# Patient Record
Sex: Male | Born: 1945 | Race: White | Hispanic: No | Marital: Married | Smoking: Current some day smoker
Health system: Southern US, Community
[De-identification: ages and names within clinical notes are randomized; demographics above are authoritative.]

## PROBLEM LIST (undated history)

## (undated) DIAGNOSIS — E119 Type 2 diabetes mellitus without complications: Secondary | ICD-10-CM

## (undated) DIAGNOSIS — F17200 Nicotine dependence, unspecified, uncomplicated: Secondary | ICD-10-CM

## (undated) DIAGNOSIS — I10 Essential (primary) hypertension: Secondary | ICD-10-CM

## (undated) DIAGNOSIS — C439 Malignant melanoma of skin, unspecified: Secondary | ICD-10-CM

## (undated) HISTORY — DX: Nicotine dependence, unspecified, uncomplicated: F17.200

## (undated) HISTORY — DX: Malignant melanoma of skin, unspecified: C43.9

## (undated) HISTORY — PX: OTHER SURGICAL HISTORY: SHX169

## (undated) HISTORY — PX: MELANOMA EXCISION: SHX5266

## (undated) HISTORY — PX: KNEE ARTHROSCOPY: SUR90

---

## 1998-02-19 ENCOUNTER — Other Ambulatory Visit: Admission: RE | Admit: 1998-02-19 | Discharge: 1998-02-19 | Payer: Self-pay | Admitting: *Deleted

## 2009-01-25 ENCOUNTER — Emergency Department (HOSPITAL_COMMUNITY): Admission: EM | Admit: 2009-01-25 | Discharge: 2009-01-25 | Payer: Self-pay | Admitting: Emergency Medicine

## 2010-04-11 LAB — BASIC METABOLIC PANEL WITH GFR
BUN: 11 mg/dL (ref 6–23)
CO2: 27 meq/L (ref 19–32)
Calcium: 9 mg/dL (ref 8.4–10.5)
Chloride: 107 meq/L (ref 96–112)
Creatinine, Ser: 0.67 mg/dL (ref 0.4–1.2)
GFR calc non Af Amer: >60 mL/min
Glucose, Bld: 83 mg/dL (ref 70–99)
Potassium: 4.2 meq/L (ref 3.5–5.1)
Sodium: 140 meq/L (ref 135–145)

## 2010-04-11 LAB — URINE CULTURE
Colony Count: NO GROWTH
Culture: NO GROWTH

## 2010-04-11 LAB — URINALYSIS, ROUTINE W REFLEX MICROSCOPIC
Bilirubin Urine: NEGATIVE
Specific Gravity, Urine: <1.005 — ABNORMAL LOW (ref 1.005–1.030)
pH: 7 (ref 5.0–8.0)

## 2010-04-11 LAB — DIFFERENTIAL
Basophils Absolute: 0 10*3/uL (ref 0.0–0.1)
Basophils Relative: 0 % (ref 0–1)
Eosinophils Absolute: 0.1 10*3/uL (ref 0.0–0.7)
Eosinophils Relative: 1 % (ref 0–5)
Lymphocytes Relative: 20 % (ref 12–46)
Lymphs Abs: 1.5 10*3/uL (ref 0.7–4.0)
Monocytes Absolute: 0.6 10*3/uL (ref 0.1–1.0)
Monocytes Relative: 8 % (ref 3–12)
Neutro Abs: 5.5 10*3/uL (ref 1.7–7.7)
Neutrophils Relative %: 71 % (ref 43–77)

## 2010-04-11 LAB — CBC
Hemoglobin: 15 g/dL (ref 12.0–15.0)
MCHC: 34.6 g/dL (ref 30.0–36.0)
Platelets: 139 10*3/uL — ABNORMAL LOW (ref 150–400)
RBC: 4.48 MIL/uL (ref 3.87–5.11)
RDW: 13.3 % (ref 11.5–15.5)
WBC: 7.7 10*3/uL (ref 4.0–10.5)

## 2010-04-11 LAB — URINE MICROSCOPIC-ADD ON

## 2011-06-27 DIAGNOSIS — E119 Type 2 diabetes mellitus without complications: Secondary | ICD-10-CM | POA: Diagnosis not present

## 2011-06-27 DIAGNOSIS — I10 Essential (primary) hypertension: Secondary | ICD-10-CM | POA: Diagnosis not present

## 2011-06-27 DIAGNOSIS — R05 Cough: Secondary | ICD-10-CM | POA: Diagnosis not present

## 2011-07-21 ENCOUNTER — Telehealth: Payer: Self-pay

## 2011-07-21 NOTE — Telephone Encounter (Signed)
Pt says he is working out of town now. He will know more in a couple of weeks and will let me know.

## 2011-09-05 NOTE — Telephone Encounter (Signed)
Letter mailed to pt to call.  

## 2011-12-16 DIAGNOSIS — I10 Essential (primary) hypertension: Secondary | ICD-10-CM | POA: Diagnosis not present

## 2011-12-16 DIAGNOSIS — E119 Type 2 diabetes mellitus without complications: Secondary | ICD-10-CM | POA: Diagnosis not present

## 2012-03-03 ENCOUNTER — Emergency Department (HOSPITAL_COMMUNITY): Payer: 59

## 2012-03-03 ENCOUNTER — Emergency Department (HOSPITAL_COMMUNITY)
Admission: EM | Admit: 2012-03-03 | Discharge: 2012-03-03 | Disposition: A | Discharge status: Home / Self Care | Payer: 59 | Attending: Emergency Medicine | Admitting: Emergency Medicine

## 2012-03-03 ENCOUNTER — Other Ambulatory Visit: Payer: Self-pay

## 2012-03-03 ENCOUNTER — Encounter (HOSPITAL_COMMUNITY): Payer: Self-pay | Admitting: *Deleted

## 2012-03-03 DIAGNOSIS — Z79899 Other long term (current) drug therapy: Secondary | ICD-10-CM | POA: Diagnosis not present

## 2012-03-03 DIAGNOSIS — I10 Essential (primary) hypertension: Secondary | ICD-10-CM | POA: Insufficient documentation

## 2012-03-03 DIAGNOSIS — F17200 Nicotine dependence, unspecified, uncomplicated: Secondary | ICD-10-CM | POA: Diagnosis not present

## 2012-03-03 DIAGNOSIS — Z85820 Personal history of malignant melanoma of skin: Secondary | ICD-10-CM | POA: Insufficient documentation

## 2012-03-03 DIAGNOSIS — E119 Type 2 diabetes mellitus without complications: Secondary | ICD-10-CM | POA: Diagnosis not present

## 2012-03-03 DIAGNOSIS — Z7982 Long term (current) use of aspirin: Secondary | ICD-10-CM | POA: Diagnosis not present

## 2012-03-03 DIAGNOSIS — R079 Chest pain, unspecified: Secondary | ICD-10-CM | POA: Insufficient documentation

## 2012-03-03 HISTORY — DX: Essential (primary) hypertension: I10

## 2012-03-03 HISTORY — DX: Type 2 diabetes mellitus without complications: E11.9

## 2012-03-03 LAB — BASIC METABOLIC PANEL
BUN: 15 mg/dL (ref 6–23)
Calcium: 9.3 mg/dL (ref 8.4–10.5)
Creatinine, Ser: 0.8 mg/dL (ref 0.50–1.35)
GFR calc non Af Amer: 89 mL/min — ABNORMAL LOW (ref >90)
Potassium: 3.6 mEq/L (ref 3.5–5.1)

## 2012-03-03 LAB — CBC
MCH: 33.2 pg (ref 26.0–34.0)
MCHC: 35.3 g/dL (ref 30.0–36.0)
MCV: 94.1 fL (ref 78.0–100.0)
Platelets: 143 10*3/uL — ABNORMAL LOW (ref 150–400)
RBC: 4.25 MIL/uL (ref 4.22–5.81)

## 2012-03-03 LAB — TROPONIN I: Troponin I: <0.3 ng/mL (ref <0.30)

## 2012-03-03 MED ORDER — HYDROCODONE-ACETAMINOPHEN 5-325 MG PO TABS: 1.0000 | ORAL_TABLET | Freq: Four times a day (QID) | ORAL | Status: DC | PRN

## 2012-03-03 MED ORDER — ASPIRIN 81 MG PO CHEW
324.0000 mg | CHEWABLE_TABLET | Freq: Once | ORAL | Status: AC
  Administered 2012-03-03: 162 mg via ORAL
  Filled 2012-03-03: qty 4

## 2012-03-03 MED ORDER — IPRATROPIUM BROMIDE 0.02 % IN SOLN: 0.5000 mg | Freq: Once | RESPIRATORY_TRACT | Status: DC

## 2012-03-03 MED ORDER — ALBUTEROL SULFATE (5 MG/ML) 0.5% IN NEBU: 2.5000 mg | INHALATION_SOLUTION | Freq: Once | RESPIRATORY_TRACT | Status: DC

## 2012-03-03 MED ORDER — KETOROLAC TROMETHAMINE 30 MG/ML IJ SOLN
30.0000 mg | Freq: Once | INTRAMUSCULAR | Status: AC
  Administered 2012-03-03: 30 mg via INTRAMUSCULAR
  Filled 2012-03-03: qty 1

## 2012-03-03 NOTE — ED Notes (Signed)
States he is feeling better now, states pain is much better after medication.

## 2012-03-03 NOTE — ED Notes (Signed)
Pt reporting pain in right side and into back.  Pt denies recent illness.  Denies nausea, vomiting, SOB or cough.

## 2012-03-03 NOTE — ED Notes (Signed)
MD at bedside. 

## 2012-03-03 NOTE — ED Notes (Signed)
Patient states that his right sided chest pain continues, denies shortness of breath.  States that the pain is intermittent in nature and has gotten worse since 3 am today.

## 2012-03-03 NOTE — ED Provider Notes (Addendum)
History     CSN: [id number]  Arrival date & time 03/03/12  0602   First MD Initiated Contact with Patient 03/03/12 0622      Chief Complaint  Patient presents with  . Chest Pain    (Consider location/radiation/quality/duration/timing/severity/associated sxs/prior treatment) HPI Scott Ballard is a 67 y.o. male presents with acute onset of right sided chest pain located along the mid axillary line it radiates to the back describes this as a sharp pain, up to a 5 or 6/10 in intensity, it comes and goes and lasts for 20-30 minutes when it is present, then resolves to about a 1-2/10.  This pain awoke the patient at 2:00 this morning, continue to bother him all night, he got up to 5 to check a blood glucose and eat a couple of pops charts, and then decided the pain was becoming too intense and decided to come to the ER. Eating food did not exacerbate or alleviate the problem. He's not taken anything else. There are no other alleviating or exacerbating factors. Patient denies any shortness of breath, denies pleuritic chest pain, denies any recent cough, upper respiratory symptoms including Sore throat or rhinorrhea, ear pain, fever or chills.  He denies hemoptysis, any recent extended travel, any recent cancer treatments, or long bone fractures. Patient does have a history of melanoma excision on the right lower oblique region as well as along the upper middle back and one over the right scapula - this was over 10 years ago.  Patient has a history of diabetes, he says he tends to run low, and has had one low episode last week and is typically resolved by eating. Patient also has a history of hypertension. No family history of DVT or thromboembolic disease. Patient denies any alcohol or illegal drug use, says he does chew on cigars and occasionally lights them.   Past Medical History  Diagnosis Date  . Diabetes mellitus without complication   . Hypertension     Past Surgical History  Procedure  Laterality Date  . Joint replacement    . Melanoma excision      History reviewed. No pertinent family history.  History  Substance Use Topics  . Smoking status: Current Every Day Smoker    Types: Cigars  . Smokeless tobacco: Not on file  . Alcohol Use: No      Review of Systems At least 10pt or greater review of systems completed and are negative except where specified in the HPI.  Allergies  Review of patient's allergies indicates no known allergies.  Home Medications   Current Outpatient Rx  Name  Route  Sig  Dispense  Refill  . aspirin 81 MG tablet   Oral   Take 81 mg by mouth daily.         Marland Kitchen lisinopril (PRINIVIL,ZESTRIL) 10 MG tablet   Oral   Take 10 mg by mouth daily.         . vitamin B-12 (CYANOCOBALAMIN) 100 MCG tablet   Oral   Take 50 mcg by mouth daily.           There were no vitals taken for this visit.  Physical Exam  Nursing notes reviewed.  Electronic medical record reviewed. VITAL SIGNS:   Filed Vitals:   03/03/12 0627  BP: 149/64  Pulse: 57  Temp: 97.8 F (36.6 C)  TempSrc: Oral  Resp: 15  Height: 6\' 6"  (1.981 m)  Weight: 212 lb (96.163 kg)  SpO2: 99%   CONSTITUTIONAL: Awake,  oriented, appears non-toxic HENT: Atraumatic, normocephalic, oral mucosa pink and moist, airway patent. Nares patent without drainage. External ears normal. EYES: Conjunctiva clear, EOMI, PERRLA NECK: Trachea midline, non-tender, supple CARDIOVASCULAR: Normal heart rate, Normal rhythm, No murmurs, rubs, gallops PULMONARY/CHEST: Clear to auscultation, no rhonchi, wheezes, or rales. Symmetrical breath sounds. Non-tender. ABDOMINAL: Non-distended, soft, non-tender - no rebound or guarding.  BS normal. NEUROLOGIC: Non-focal, moving all four extremities, no gross sensory or motor deficits. EXTREMITIES: No clubbing, cyanosis, or edema SKIN: Fair skinned, Warm, Dry, No erythema, No rash. 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  ED Course  Procedures (including critical care time)  Date: 03/03/2012  Rate: 54  Rhythm: normal sinus rhythm  QRS Axis: normal  Intervals: normal  ST/T Wave abnormalities: T-wave flattening in lead 3 and aVF  Conduction Disutrbances: First degree AV block, LAFB  Narrative Interpretation: This is a nonspecific flattening of T waves in leads 3 and aVF, otherwise EKG is unremarkable. No acute ischemia or infarction. No prior EKG for comparison.   Labs Reviewed  CBC - Abnormal; Notable for the following:    Platelets 143 (*)    All other components within normal limits  BASIC METABOLIC PANEL - Abnormal; Notable for the following:    Sodium 134 (*)    Glucose, Bld 205 (*)    GFR calc non Af Amer 89 (*)    All other components within normal limits  TROPONIN I   Dg Chest 2 View  03/03/2012  *RADIOLOGY REPORT*  Clinical Data: Chest pain  CHEST - 2 VIEW  Comparison: None.  Findings: Lucency in the upper lungs suggesting emphysematous change.  Normal heart size and pulmonary vascularity.  No focal consolidation or airspace disease.  No blunting of costophrenic angles.  No pneumothorax.  Calcification of the aorta. Degenerative changes in the spine.  IMPRESSION: Probable emphysematous changes in the upper lungs.  No evidence of active pulmonary disease.   Original Report Authenticated By: Burman Nieves, M.D.      1. Chest pain       MDM  Scott Ballard is a 67 y.o. male history of diabetes, hypertension and melanoma presents with right-sided chest pain that radiates to the back. Patient has no explicit back pain, no night sweats, no weight loss. Patient has no other symptoms.  Check an EKG, basic labs, cardiac biomarkers well as chest x-ray - which are all within normal limits. Patient does have slightly elevated glucose however he does need to pop tart  prior to coming to the emergency department. Patient's pain is better with Toradol, is now currently a 1/10.  Discussed obtaining risks and benefits of obtaining a CT of the chest with contrast and the risk of contrast-induced nephropathy, patient wishes to defer that as I have a low pretest probability for finding any pathology in his chest at this time. Do not think the patient has a PE.  I also do not think this atypical presentation, even in a diabetic, is concerning for ACS.  Patient will followup with his primary care physician later this week for possible repeat chest x-ray and reexamination.  Patient is to return to the emergency department if any new symptoms arise or should current symptoms worsen. Patient understands and accepts the medical plan as it's been dictated, his questions as well as  questions have been answered.          Jones Skene, MD 03/03/12 1610  Jones Skene, MD 03/03/12 [id number]

## 2012-12-19 ENCOUNTER — Other Ambulatory Visit: Payer: Self-pay | Admitting: Family Medicine

## 2013-01-22 ENCOUNTER — Other Ambulatory Visit: Payer: Self-pay | Admitting: Family Medicine

## 2013-01-28 ENCOUNTER — Ambulatory Visit (INDEPENDENT_AMBULATORY_CARE_PROVIDER_SITE_OTHER): Payer: 59 | Admitting: Physician Assistant

## 2013-01-28 ENCOUNTER — Encounter: Payer: Self-pay | Admitting: Physician Assistant

## 2013-01-28 VITALS — BP 158/80 | HR 64 | Temp 98.7°F | Resp 18 | Ht 75.0 in | Wt 235.0 lb

## 2013-01-28 DIAGNOSIS — I10 Essential (primary) hypertension: Secondary | ICD-10-CM

## 2013-01-28 DIAGNOSIS — E119 Type 2 diabetes mellitus without complications: Secondary | ICD-10-CM | POA: Diagnosis not present

## 2013-01-28 DIAGNOSIS — F17200 Nicotine dependence, unspecified, uncomplicated: Secondary | ICD-10-CM

## 2013-01-28 LAB — BASIC METABOLIC PANEL WITH GFR
BUN: 13 mg/dL (ref 6–23)
CALCIUM: 8.6 mg/dL (ref 8.4–10.5)
CHLORIDE: 104 meq/L (ref 96–112)
CO2: 27 mEq/L (ref 19–32)
CREATININE: 0.81 mg/dL (ref 0.50–1.35)
GFR, Est Non African American: >89 mL/min
Glucose, Bld: 129 mg/dL — ABNORMAL HIGH (ref 70–99)
POTASSIUM: 4.2 meq/L (ref 3.5–5.3)
Sodium: 139 mEq/L (ref 135–145)

## 2013-01-28 LAB — HEMOGLOBIN A1C
Hgb A1c MFr Bld: 6 % — ABNORMAL HIGH (ref <5.7)
Mean Plasma Glucose: 126 mg/dL — ABNORMAL HIGH (ref <117)

## 2013-01-28 MED ORDER — LISINOPRIL 20 MG PO TABS: ORAL_TABLET | ORAL | Status: DC

## 2013-01-28 NOTE — Progress Notes (Signed)
Patient ID: Scott Ballard MRN: [id number], DOB: 28-Feb-1945, 68 y.o. Date of Encounter: @DATE @  Chief Complaint: No chief complaint on file.   HPI: 68 y.o. year old white male  presents for routine followup office visit.  His last office visit here was actually November 2013. It has been a little over one year. He says that he just hates to come to the doctor. He states that he had been taking his blood pressure medication as directed every day. Says that he called and got a refill that it just ran out about 2 days ago. He been taking the blood pressure medication daily except for just the past 2-3 days.  As well he has a history of diabetes which has been controlled with diet. He says that this was diagnosed at his first visit here in May 2009. He also notes that he weighed 280 pounds at that visit. I did review the chart and that is in fact true. His first visit here was May 2009 and his weight was exactly 280 pounds. He says that he was very compliant with diet changes. Says that he "lost 10 pounds in the first week after that visit".  As he continues to be compliant with diet changes. As well he says that he checks his blood sugar every morning and checks it at night once or twice per week. Visit the morning reading is 95-110. Nighttime reading is around 1:30.  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 He works 5 hours per day 4 days per week.  He smokes one cigar per week. This in the past he smoked one cigar daily. Has never smoked cigarettes.  Even with exertion, he has no chest discomfort and no increased shortness of breath/dyspnea on exertion. He has no complaints today and says he's been feeling good.   Past Medical History  Diagnosis Date  . Smoker   . Diabetes mellitus without complication   . Hypertension      Home Meds: See attached medication section for current medication list. Any medications entered into  computer today will not appear on this note's list. The medications listed below were entered prior to today. Current Outpatient Prescriptions on File Prior to Visit  Medication Sig Dispense Refill  . aspirin 81 MG tablet Take 81 mg by mouth daily.      . vitamin B-12 (CYANOCOBALAMIN) 100 MCG tablet Take 50 mcg by mouth daily.      Marland Kitchen HYDROcodone-acetaminophen (NORCO/VICODIN) 5-325 MG per tablet Take 1-2 tablets by mouth every 6 (six) hours as needed for pain.  13 tablet  0   No current facility-administered medications on file prior to visit.    Allergies: No Known Allergies  History   Social History  . Marital Status: Married    Spouse Name: N/A    Number of Children: N/A  . Years of Education: N/A   Occupational History  . Not on file.   Social History Main Topics  . Smoking status: Current Every Day Smoker    Types: Cigars  . Smokeless tobacco: Not on file  . Alcohol Use: No  . Drug Use: No  . Sexual Activity: Not on file   Other Topics Concern  . Not on file   Social History Narrative  . No narrative on file    No family history on file.   Review of Systems:  See HPI for pertinent ROS. All other ROS  negative.    Physical Exam: Blood pressure 158/80, pulse 64, temperature 98.7 F (37.1 C), resp. rate 18, height 6\' 3"  (1.905 m), weight 235 lb (106.595 kg)., Body mass index is 29.37 kg/(m^2). General:Tall, WNWD WM.  Appears in no acute distress. Neck: Supple. No thyromegaly. No lymphadenopathy. No carotid bruits. Lungs: Clear bilaterally to auscultation without wheezes, rales, or rhonchi. Breathing is unlabored. Heart: RRR with S1 S2. No murmurs, rubs, or gallops. Abdomen: Soft, non-tender, non-distended with normoactive bowel sounds. No hepatomegaly. No rebound/guarding. No obvious abdominal masses. Musculoskeletal:  Strength and tone normal for age. Extremities/Skin: Warm and dry. No clubbing or cyanosis. No edema. No rashes or suspicious lesions. Neuro: Alert  and oriented X 3. Moves all extremities spontaneously. Gait is normal. CNII-XII grossly in tact. Psych:  Responds to questions appropriately with a normal affect.     ASSESSMENT AND PLAN:  68 y.o. year old male with  1. Hypertension Blood pressure is slightly high today but he has been out of his blood pressure medication for 3 days. We'll go ahead and check labs monitor her renal function. He is to resume his blood pressure medication and return for blood pressure check. - BASIC METABOLIC PANEL WITH GFR - lisinopril (PRINIVIL,ZESTRIL) 20 MG tablet; TAKE 1 TABLET BY MOUTH EVERY DAY  Dispense: 30 tablet; Refill: 5  2. Diabetes mellitus without complication Diet controlled. According to his reported blood sugar readings this is continued to be controlled. We'll check A1c to be sure. - Hemoglobin A1c  3. Smoker He has decreased from one cigar daily down to one cigar 3 days per week. He is to continue to try to wean this down.  4. lipid panel was performed 06/28/2011 and was excellent. Triglycerides 124, HDL 41, LDL 77.  He can wait 6 months for regular office visit as labs remain stable. Otherwise return sooner.  We'll update preventive treatment at that next visit.   Signed, [address] [place], [place], [facility] 01/28/2013 9:32 AM

## 2013-01-29 ENCOUNTER — Encounter: Payer: Self-pay | Admitting: Family Medicine

## 2013-02-14 ENCOUNTER — Other Ambulatory Visit: Payer: Self-pay | Admitting: Physician Assistant

## 2013-02-14 DIAGNOSIS — E119 Type 2 diabetes mellitus without complications: Secondary | ICD-10-CM

## 2013-02-14 NOTE — Telephone Encounter (Signed)
Test strips refilled

## 2013-02-24 ENCOUNTER — Other Ambulatory Visit: Payer: Self-pay | Admitting: Physician Assistant

## 2013-02-25 NOTE — Telephone Encounter (Signed)
Medication refilled per protocol. 

## 2013-07-29 ENCOUNTER — Ambulatory Visit: Payer: Medicare Other | Admitting: Physician Assistant

## 2013-08-03 ENCOUNTER — Other Ambulatory Visit: Payer: Self-pay | Admitting: *Deleted

## 2013-08-03 DIAGNOSIS — I10 Essential (primary) hypertension: Secondary | ICD-10-CM

## 2013-08-03 DIAGNOSIS — E119 Type 2 diabetes mellitus without complications: Secondary | ICD-10-CM

## 2013-08-08 DIAGNOSIS — Z85820 Personal history of malignant melanoma of skin: Secondary | ICD-10-CM | POA: Diagnosis not present

## 2013-08-08 DIAGNOSIS — D235 Other benign neoplasm of skin of trunk: Secondary | ICD-10-CM | POA: Diagnosis not present

## 2013-08-08 DIAGNOSIS — L570 Actinic keratosis: Secondary | ICD-10-CM | POA: Diagnosis not present

## 2013-08-09 ENCOUNTER — Other Ambulatory Visit: Payer: Medicare Other

## 2013-08-09 DIAGNOSIS — I10 Essential (primary) hypertension: Secondary | ICD-10-CM | POA: Diagnosis not present

## 2013-08-09 DIAGNOSIS — E119 Type 2 diabetes mellitus without complications: Secondary | ICD-10-CM | POA: Diagnosis not present

## 2013-08-09 LAB — COMPLETE METABOLIC PANEL WITH GFR
ALK PHOS: 48 U/L (ref 39–117)
ALT: 13 U/L (ref 0–53)
AST: 15 U/L (ref 0–37)
Albumin: 3.9 g/dL (ref 3.5–5.2)
BILIRUBIN TOTAL: 0.9 mg/dL (ref 0.2–1.2)
BUN: 14 mg/dL (ref 6–23)
CALCIUM: 8.8 mg/dL (ref 8.4–10.5)
CO2: 26 meq/L (ref 19–32)
CREATININE: 0.82 mg/dL (ref 0.50–1.35)
Chloride: 105 mEq/L (ref 96–112)
Glucose, Bld: 95 mg/dL (ref 70–99)
Potassium: 4.6 mEq/L (ref 3.5–5.3)
Sodium: 139 mEq/L (ref 135–145)
Total Protein: 6.4 g/dL (ref 6.0–8.3)

## 2013-08-09 LAB — CBC WITH DIFFERENTIAL/PLATELET
Basophils Absolute: 0 10*3/uL (ref 0.0–0.1)
Basophils Relative: 0 % (ref 0–1)
EOS ABS: 0.1 10*3/uL (ref 0.0–0.7)
EOS PCT: 2 % (ref 0–5)
HEMATOCRIT: 41.6 % (ref 39.0–52.0)
Hemoglobin: 14.4 g/dL (ref 13.0–17.0)
LYMPHS PCT: 30 % (ref 12–46)
Lymphs Abs: 1.3 10*3/uL (ref 0.7–4.0)
MCH: 32.4 pg (ref 26.0–34.0)
MCHC: 34.6 g/dL (ref 30.0–36.0)
MCV: 93.5 fL (ref 78.0–100.0)
MONO ABS: 0.4 10*3/uL (ref 0.1–1.0)
Monocytes Relative: 10 % (ref 3–12)
NEUTROS ABS: 2.4 10*3/uL (ref 1.7–7.7)
Neutrophils Relative %: 58 % (ref 43–77)
PLATELETS: 162 10*3/uL (ref 150–400)
RBC: 4.45 MIL/uL (ref 4.22–5.81)
RDW: 14.6 % (ref 11.5–15.5)
WBC: 4.2 10*3/uL (ref 4.0–10.5)

## 2013-08-09 LAB — HEMOGLOBIN A1C
HEMOGLOBIN A1C: 5.8 % — AB (ref <5.7)
Mean Plasma Glucose: 120 mg/dL — ABNORMAL HIGH (ref <117)

## 2013-08-12 ENCOUNTER — Ambulatory Visit (INDEPENDENT_AMBULATORY_CARE_PROVIDER_SITE_OTHER): Payer: 59 | Admitting: Physician Assistant

## 2013-08-12 ENCOUNTER — Encounter: Payer: Self-pay | Admitting: Family Medicine

## 2013-08-12 ENCOUNTER — Encounter: Payer: Self-pay | Admitting: Physician Assistant

## 2013-08-12 VITALS — BP 118/66 | HR 72 | Temp 98.9°F | Resp 18 | Wt 228.0 lb

## 2013-08-12 DIAGNOSIS — Z23 Encounter for immunization: Secondary | ICD-10-CM | POA: Diagnosis not present

## 2013-08-12 DIAGNOSIS — F17200 Nicotine dependence, unspecified, uncomplicated: Secondary | ICD-10-CM | POA: Diagnosis not present

## 2013-08-12 DIAGNOSIS — I10 Essential (primary) hypertension: Secondary | ICD-10-CM | POA: Diagnosis not present

## 2013-08-12 DIAGNOSIS — E119 Type 2 diabetes mellitus without complications: Secondary | ICD-10-CM

## 2013-08-12 NOTE — Progress Notes (Signed)
Patient ID: Sofia Jaquith MRN: [id number], DOB: 1945/10/20, 68 y.o. Date of Encounter: '@DATE' @  Chief Complaint:  Chief Complaint  Patient presents with  . diabetic check up    had labs friday    HPI: 68 y.o. year old white male  presents for routine followup office visit.  His last office visit was 01/28/2013.  Prior to that visit, his LOV had been  November 2013. When he came 01/28/13 he said that he just hates to come to the doctor. Again today he says he came today "just to get his wife off his back" !!  He states that he had been taking his blood pressure medication as directed every day.  As well he has a history of diabetes which has been controlled with diet. He says that this was diagnosed at his first visit here in May 2009. He also notes that he weighed 280 pounds at that visit. I did review the chart and that is in fact true. His first visit here was May 2009 and his weight was exactly 280 pounds. He says that he was very compliant with diet changes. Says that he "lost 10 pounds in the first week after that visit".  Says he continues to be compliant with diet changes. As well he says that he checks his blood sugar every morning.  the morning reading is 88 - 110.   He "is retired" --now only works 2 days a week.  Has never smoked cigarettes. Has smoked cigars for years. Today says "chews on cigars" -- 1 every 2 days.  Even with exertion, he has no chest discomfort and no increased shortness of breath/dyspnea on exertion. He has no complaints today and says he's been feeling good.   Past Medical History  Diagnosis Date  . Smoker   . Diabetes mellitus without complication   . Hypertension      Home Meds: Outpatient Prescriptions Prior to Visit  Medication Sig Dispense Refill  . aspirin 81 MG tablet Take 81 mg by mouth daily.      Marland Kitchen glucose blood (ACCU-CHEK AVIVA PLUS) test strip 1 each by Other route daily.  100 each  6  . lisinopril (PRINIVIL,ZESTRIL) 20 MG tablet  TAKE 1 TABLET BY MOUTH EVERY DAY  30 tablet  5  . vitamin B-12 (CYANOCOBALAMIN) 100 MCG tablet Take 50 mcg by mouth daily.      Marland Kitchen HYDROcodone-acetaminophen (NORCO/VICODIN) 5-325 MG per tablet Take 1-2 tablets by mouth every 6 (six) hours as needed for pain.  13 tablet  0  . lisinopril (PRINIVIL,ZESTRIL) 20 MG tablet TAKE 1 TABLET BY MOUTH EVERY DAY  30 tablet  5   No facility-administered medications prior to visit.     Allergies: No Known Allergies  History   Social History  . Marital Status: Married    Spouse Name: N/A    Number of Children: N/A  . Years of Education: N/A   Occupational History  . Not on file.   Social History Main Topics  . Smoking status: Current Every Day Smoker    Types: Cigars  . Smokeless tobacco: Not on file  . Alcohol Use: No  . Drug Use: No  . Sexual Activity: Not on file   Other Topics Concern  . Not on file   Social History Narrative  . No narrative on file    No family history on file.   Review of Systems:  See HPI for pertinent ROS. All other ROS negative.  Physical Exam: Blood pressure 118/66, pulse 72, temperature 98.9 F (37.2 C), temperature source Oral, resp. rate 18, weight 228 lb (103.42 kg)., Body mass index is 28.5 kg/(m^2). General:Tall, WNWD WM.  Appears in no acute distress. Neck: Supple. No thyromegaly. No lymphadenopathy. No carotid bruits. Lungs: Clear bilaterally to auscultation without wheezes, rales, or rhonchi. Breathing is unlabored. Heart: RRR with S1 S2. No murmurs, rubs, or gallops. Abdomen: Soft, non-tender, non-distended with normoactive bowel sounds. No hepatomegaly. No rebound/guarding. No obvious abdominal masses. Musculoskeletal:  Strength and tone normal for age. Extremities/Skin: Warm and dry.  Neuro: Alert and oriented X 3. Moves all extremities spontaneously. Gait is normal. CNII-XII grossly in tact. Psych:  Responds to questions appropriately with a normal affect.   Results for orders placed in  visit on 08/09/13  CBC WITH DIFFERENTIAL      Result Value Ref Range   WBC 4.2  4.0 - 10.5 K/uL   RBC 4.45  4.22 - 5.81 MIL/uL   Hemoglobin 14.4  13.0 - 17.0 g/dL   HCT 41.6  39.0 - 52.0 %   MCV 93.5  78.0 - 100.0 fL   MCH 32.4  26.0 - 34.0 pg   MCHC 34.6  30.0 - 36.0 g/dL   RDW 14.6  11.5 - 15.5 %   Platelets 162  150 - 400 K/uL   Neutrophils Relative % 58  43 - 77 %   Neutro Abs 2.4  1.7 - 7.7 K/uL   Lymphocytes Relative 30  12 - 46 %   Lymphs Abs 1.3  0.7 - 4.0 K/uL   Monocytes Relative 10  3 - 12 %   Monocytes Absolute 0.4  0.1 - 1.0 K/uL   Eosinophils Relative 2  0 - 5 %   Eosinophils Absolute 0.1  0.0 - 0.7 K/uL   Basophils Relative 0  0 - 1 %   Basophils Absolute 0.0  0.0 - 0.1 K/uL   Smear Review Criteria for review not met    COMPLETE METABOLIC PANEL WITH GFR      Result Value Ref Range   Sodium 139  135 - 145 mEq/L   Potassium 4.6  3.5 - 5.3 mEq/L   Chloride 105  96 - 112 mEq/L   CO2 26  19 - 32 mEq/L   Glucose, Bld 95  70 - 99 mg/dL   BUN 14  6 - 23 mg/dL   Creat 0.82  0.50 - 1.35 mg/dL   Total Bilirubin 0.9  0.2 - 1.2 mg/dL   Alkaline Phosphatase 48  39 - 117 U/L   AST 15  0 - 37 U/L   ALT 13  0 - 53 U/L   Total Protein 6.4  6.0 - 8.3 g/dL   Albumin 3.9  3.5 - 5.2 g/dL   Calcium 8.8  8.4 - 10.5 mg/dL   GFR, Est African American >89     GFR, Est Non African American >89    HEMOGLOBIN A1C      Result Value Ref Range   Hemoglobin A1C 5.8 (*) <5.7 %   Mean Plasma Glucose 120 (*) <117 mg/dL     ASSESSMENT AND PLAN:  68 y.o. year old male with  1. Hypertension Blood pressure is at goal. Lab is nml. Cont currnet BP med.   2. Diabetes mellitus without complication Diet controlled.  3. Smoker He has decreased -- He has weaned this down.  4. lipid panel was performed 06/28/2011 and was excellent. Triglycerides 124, HDL 41,  LDL 77.  5. Screening Colonoscopy: Staff today tells me that we have scheduled him for colonoscopy 3 times and he has canceled all of  them. However the patient tells me that he currently has a notification at now going for a colonoscopy and he says that he does plan to followup with going for this one.  Offered to do Hemoccult cards instead he says he is: To go for colonoscopy  6. Immunizations: Tetanus: He says he has had no tetanus vaccine in the past 10 years. He is agreeable for [place] to give this today. Pneumonia vaccine: He received Pneumovax 23 06/06/2007. Will give Prevnar 13 today. He is agreeable.  He can wait 6 months for regular office visit as labs remain stable. Otherwise return sooner.  Need for prophylactic vaccination against Streptococcus pneumoniae (pneumococcus) - Pneumococcal conjugate vaccine 13-valent  Need for prophylactic vaccination with combined diphtheria-tetanus-pertussis (DTP) vaccine - Tdap vaccine greater than or equal to 7yo IM    Signed, [address] [place], [place], [facility] 08/12/2013 3:57 PM

## 2013-08-29 ENCOUNTER — Other Ambulatory Visit: Payer: Self-pay | Admitting: Physician Assistant

## 2013-08-29 NOTE — Telephone Encounter (Signed)
Refill appropriate and filled per protocol. 

## 2013-09-03 ENCOUNTER — Other Ambulatory Visit: Payer: Self-pay | Admitting: Physician Assistant

## 2013-09-22 ENCOUNTER — Encounter (HOSPITAL_COMMUNITY): Payer: Self-pay | Admitting: Emergency Medicine

## 2013-09-22 ENCOUNTER — Emergency Department (HOSPITAL_COMMUNITY): Payer: 59

## 2013-09-22 ENCOUNTER — Emergency Department (HOSPITAL_COMMUNITY)
Admission: EM | Admit: 2013-09-22 | Discharge: 2013-09-23 | Disposition: A | Discharge status: Home / Self Care | Payer: 59 | Attending: Emergency Medicine | Admitting: Emergency Medicine

## 2013-09-22 DIAGNOSIS — E876 Hypokalemia: Secondary | ICD-10-CM | POA: Diagnosis not present

## 2013-09-22 DIAGNOSIS — R1011 Right upper quadrant pain: Secondary | ICD-10-CM | POA: Diagnosis not present

## 2013-09-22 DIAGNOSIS — R079 Chest pain, unspecified: Secondary | ICD-10-CM | POA: Insufficient documentation

## 2013-09-22 DIAGNOSIS — I10 Essential (primary) hypertension: Secondary | ICD-10-CM | POA: Diagnosis not present

## 2013-09-22 DIAGNOSIS — F17200 Nicotine dependence, unspecified, uncomplicated: Secondary | ICD-10-CM | POA: Diagnosis not present

## 2013-09-22 DIAGNOSIS — K8020 Calculus of gallbladder without cholecystitis without obstruction: Secondary | ICD-10-CM | POA: Diagnosis not present

## 2013-09-22 DIAGNOSIS — E871 Hypo-osmolality and hyponatremia: Secondary | ICD-10-CM | POA: Diagnosis not present

## 2013-09-22 DIAGNOSIS — E119 Type 2 diabetes mellitus without complications: Secondary | ICD-10-CM | POA: Insufficient documentation

## 2013-09-22 DIAGNOSIS — Z7982 Long term (current) use of aspirin: Secondary | ICD-10-CM | POA: Diagnosis not present

## 2013-09-22 DIAGNOSIS — R0789 Other chest pain: Secondary | ICD-10-CM

## 2013-09-22 DIAGNOSIS — K8000 Calculus of gallbladder with acute cholecystitis without obstruction: Secondary | ICD-10-CM | POA: Diagnosis not present

## 2013-09-22 DIAGNOSIS — Z79899 Other long term (current) drug therapy: Secondary | ICD-10-CM | POA: Insufficient documentation

## 2013-09-22 DIAGNOSIS — D72829 Elevated white blood cell count, unspecified: Secondary | ICD-10-CM | POA: Diagnosis not present

## 2013-09-22 LAB — BASIC METABOLIC PANEL
Anion gap: 12 (ref 5–15)
BUN: 11 mg/dL (ref 6–23)
CO2: 23 meq/L (ref 19–32)
Calcium: 8.7 mg/dL (ref 8.4–10.5)
Chloride: 101 mEq/L (ref 96–112)
Creatinine, Ser: 0.86 mg/dL (ref 0.50–1.35)
GFR calc Af Amer: >90 mL/min (ref >90)
GFR calc non Af Amer: 86 mL/min — ABNORMAL LOW (ref >90)
GLUCOSE: 153 mg/dL — AB (ref 70–99)
POTASSIUM: 3.9 meq/L (ref 3.7–5.3)
SODIUM: 136 meq/L — AB (ref 137–147)

## 2013-09-22 LAB — CBC WITH DIFFERENTIAL/PLATELET
Basophils Absolute: 0 10*3/uL (ref 0.0–0.1)
Basophils Relative: 0 % (ref 0–1)
Eosinophils Absolute: 0 10*3/uL (ref 0.0–0.7)
Eosinophils Relative: 0 % (ref 0–5)
HCT: 37.8 % — ABNORMAL LOW (ref 39.0–52.0)
HEMOGLOBIN: 13.4 g/dL (ref 13.0–17.0)
LYMPHS ABS: 0.9 10*3/uL (ref 0.7–4.0)
LYMPHS PCT: 10 % — AB (ref 12–46)
MCH: 33.1 pg (ref 26.0–34.0)
MCHC: 35.4 g/dL (ref 30.0–36.0)
MCV: 93.3 fL (ref 78.0–100.0)
MONOS PCT: 11 % (ref 3–12)
Monocytes Absolute: 1.1 10*3/uL — ABNORMAL HIGH (ref 0.1–1.0)
Neutro Abs: 7.4 10*3/uL (ref 1.7–7.7)
Neutrophils Relative %: 79 % — ABNORMAL HIGH (ref 43–77)
Platelets: 121 10*3/uL — ABNORMAL LOW (ref 150–400)
RBC: 4.05 MIL/uL — AB (ref 4.22–5.81)
RDW: 13.1 % (ref 11.5–15.5)
WBC: 9.5 10*3/uL (ref 4.0–10.5)

## 2013-09-22 LAB — TROPONIN I: Troponin I: <0.3 ng/mL (ref <0.30)

## 2013-09-22 MED ORDER — ASPIRIN 81 MG PO CHEW
324.0000 mg | CHEWABLE_TABLET | Freq: Once | ORAL | Status: AC
  Administered 2013-09-22: 324 mg via ORAL
  Filled 2013-09-22: qty 4

## 2013-09-22 MED ORDER — KETOROLAC TROMETHAMINE 30 MG/ML IJ SOLN
30.0000 mg | Freq: Once | INTRAMUSCULAR | Status: AC
Start: 2013-09-22 — End: 2013-09-22
  Administered 2013-09-22: 30 mg via INTRAVENOUS
  Filled 2013-09-22: qty 1

## 2013-09-22 NOTE — ED Notes (Signed)
Pt states his "lungs" were hurting yesterday and he took  2 aleve and a zantac and he fell asleep and woke up feeling better. Pt states earlier this evening his "lungs" began to hurt again and he took a 86.70 yr old endocet with no relief. Pt is pointing to his right chest area when telling me where he hurts.

## 2013-09-22 NOTE — ED Provider Notes (Addendum)
CSN: [id number]     Arrival date & time 09/22/13  2209 History  This chart was scribed for Scott Fuel, MD by Peyton Bottoms, ED Scribe. This patient was seen in room [facility]10/[facility]10 and the patient's care was started at 11:07 PM.  Chief Complaint  Patient presents with  . Chest Pain   The history is provided by the patient. No language interpreter was used.   HPI Comments: Scott Ballard is a 68 y.o. male with a history of diabetes and hypertension, who presents to the Emergency Department complaining of constant right sided Chest pain that began 2 days ago. He rates his pain as 8/10. Pain is described as sharp. He states that nothing worsens his pain. He states that his pain occasionally radiates to the left side of his chest. He denies any associated nausea, sweating, fever, or shortness of breath. Patient smokes 1 cigar per day. He has a history of diabetes and hypertension. Past Medical History  Diagnosis Date  . Smoker   . Diabetes mellitus without complication   . Hypertension    Past Surgical History  Procedure Laterality Date  . Joint replacement    . Melanoma excision     History reviewed. No pertinent family history. History  Substance Use Topics  . Smoking status: Current Every Day Smoker    Types: Cigars  . Smokeless tobacco: Not on file  . Alcohol Use: No    Review of Systems  Constitutional: Negative for fever.  Respiratory: Negative for shortness of breath.   Cardiovascular: Positive for chest pain.  Gastrointestinal: Negative for nausea.  All other systems reviewed and are negative.  Allergies  Review of patient's allergies indicates no known allergies.  Home Medications   Prior to Admission medications   Medication Sig Start Date End Date Taking? Authorizing Provider  aspirin 81 MG tablet Take 81 mg by mouth daily.   Yes Historical Provider, MD  CINNAMON PO Take 1 capsule by mouth every other day.   Yes Historical Provider, MD  Cyanocobalamin (VITAMIN B 12  PO) Take 1 tablet by mouth daily.   Yes Historical Provider, MD  lisinopril (PRINIVIL,ZESTRIL) 20 MG tablet Take 20 mg by mouth daily.   Yes Historical Provider, MD  oxyCODONE-acetaminophen (PERCOCET/ROXICET) 5-325 MG per tablet Take 1 tablet by mouth every 4 (four) hours as needed for severe pain.   Yes Historical Provider, MD   Triage Vitals: BP 164/56  Pulse 58  Temp(Src) 98.4 F (36.9 C)  Resp 20  Ht 6\' 6"  (1.981 m)  Wt 220 lb (99.791 kg)  BMI 25.43 kg/m2  SpO2 99%  Physical Exam  Nursing note and vitals reviewed. Constitutional: He is oriented to person, place, and time. He appears well-developed and well-nourished. No distress.  HENT:  Head: Normocephalic and atraumatic.  Eyes: Conjunctivae and EOM are normal. Pupils are equal, round, and reactive to light.  Neck: Normal range of motion. Neck supple. No JVD present.  Cardiovascular: Normal rate, regular rhythm and normal heart sounds.   No murmur heard. Pulmonary/Chest: Effort normal and breath sounds normal. He has no wheezes. He has no rales.  Abdominal: Soft. Bowel sounds are normal. He exhibits no distension and no mass. There is no tenderness.  Musculoskeletal: Normal range of motion. He exhibits no edema.  Lymphadenopathy:    He has no cervical adenopathy.  Neurological: He is alert and oriented to person, place, and time. He has normal reflexes. No cranial nerve deficit. Coordination normal.  Skin: Skin is warm and  dry. No rash noted.  Psychiatric: He has a normal mood and affect. His behavior is normal. Thought content normal.   ED Course  Procedures (including critical care time)  DIAGNOSTIC STUDIES: Oxygen Saturation is 99% on RA, normal by my interpretation.    COORDINATION OF CARE: 11:11 PM- Discussed plans to order diagnostic lab work and EKG. Pt advised of plan for treatment and pt agrees.  Labs Review Labs Reviewed  CBC WITH DIFFERENTIAL  TROPONIN I  BASIC METABOLIC PANEL   Imaging Review Dg Chest  Portable 1 View  09/22/2013   CLINICAL DATA:  CHEST PAIN  EXAM: PORTABLE CHEST - 1 VIEW  COMPARISON:  03/03/2012  FINDINGS: Lungs are clear. Heart size and mediastinal contours are within normal limits. No effusion. Visualized skeletal structures are unremarkable.  IMPRESSION: No acute cardiopulmonary disease.   Electronically Signed   By: Arne Cleveland M.D.   On: 09/22/2013 22:53    Date: 09/22/2013  Rate: 70  Rhythm: normal sinus rhythm  QRS Axis: left  Intervals: normal  ST/T Wave abnormalities: normal  Conduction Disutrbances:left anterior fascicular block  Narrative Interpretation: Left anterior fascicular block. When compared with ECG of 03/03/2012, No significant changes area seen.  Old EKG Reviewed: unchanged   MDM   Final diagnoses:  Atypical chest pain    Atypical chest pain. Old records are reviewed, and he has a prior ED visit for chest pain which resolved with ketorolac. He will be given aspirin and ketorolac.  He had excellent relief of pain with ketorolac. He will be discharged with prescription for naproxen.  I personally performed the services described in this documentation, which was scribed in my presence. The recorded information has been reviewed and is accurate.     Scott Fuel, MD 99/77/41 [id number]  Scott Fuel, MD 53/20/23 3435

## 2013-09-23 ENCOUNTER — Emergency Department (HOSPITAL_COMMUNITY): Payer: 59

## 2013-09-23 ENCOUNTER — Inpatient Hospital Stay (HOSPITAL_COMMUNITY)
Admission: EM | Admit: 2013-09-23 | Discharge: 2013-09-27 | DRG: 418 | Disposition: A | Discharge status: Home / Self Care | Payer: 59 | Attending: Family Medicine | Admitting: Family Medicine

## 2013-09-23 ENCOUNTER — Encounter (HOSPITAL_COMMUNITY): Payer: Self-pay | Admitting: Emergency Medicine

## 2013-09-23 DIAGNOSIS — R079 Chest pain, unspecified: Secondary | ICD-10-CM | POA: Diagnosis not present

## 2013-09-23 DIAGNOSIS — E871 Hypo-osmolality and hyponatremia: Secondary | ICD-10-CM | POA: Diagnosis present

## 2013-09-23 DIAGNOSIS — I10 Essential (primary) hypertension: Secondary | ICD-10-CM

## 2013-09-23 DIAGNOSIS — K8000 Calculus of gallbladder with acute cholecystitis without obstruction: Principal | ICD-10-CM | POA: Diagnosis present

## 2013-09-23 DIAGNOSIS — Z9660 Presence of unspecified orthopedic joint implant: Secondary | ICD-10-CM | POA: Diagnosis not present

## 2013-09-23 DIAGNOSIS — K8020 Calculus of gallbladder without cholecystitis without obstruction: Secondary | ICD-10-CM | POA: Diagnosis not present

## 2013-09-23 DIAGNOSIS — K810 Acute cholecystitis: Secondary | ICD-10-CM

## 2013-09-23 DIAGNOSIS — F17200 Nicotine dependence, unspecified, uncomplicated: Secondary | ICD-10-CM

## 2013-09-23 DIAGNOSIS — D72829 Elevated white blood cell count, unspecified: Secondary | ICD-10-CM | POA: Diagnosis present

## 2013-09-23 DIAGNOSIS — E876 Hypokalemia: Secondary | ICD-10-CM | POA: Diagnosis present

## 2013-09-23 DIAGNOSIS — Z85820 Personal history of malignant melanoma of skin: Secondary | ICD-10-CM

## 2013-09-23 DIAGNOSIS — Z7982 Long term (current) use of aspirin: Secondary | ICD-10-CM

## 2013-09-23 DIAGNOSIS — E119 Type 2 diabetes mellitus without complications: Secondary | ICD-10-CM | POA: Diagnosis not present

## 2013-09-23 DIAGNOSIS — R1011 Right upper quadrant pain: Secondary | ICD-10-CM | POA: Diagnosis not present

## 2013-09-23 DIAGNOSIS — R0789 Other chest pain: Secondary | ICD-10-CM | POA: Diagnosis not present

## 2013-09-23 LAB — CBC WITH DIFFERENTIAL/PLATELET
Basophils Absolute: 0 10*3/uL (ref 0.0–0.1)
Basophils Relative: 0 % (ref 0–1)
EOS PCT: 0 % (ref 0–5)
Eosinophils Absolute: 0 10*3/uL (ref 0.0–0.7)
HEMATOCRIT: 39 % (ref 39.0–52.0)
Hemoglobin: 14 g/dL (ref 13.0–17.0)
LYMPHS ABS: 0.7 10*3/uL (ref 0.7–4.0)
LYMPHS PCT: 7 % — AB (ref 12–46)
MCH: 33.2 pg (ref 26.0–34.0)
MCHC: 35.9 g/dL (ref 30.0–36.0)
MCV: 92.4 fL (ref 78.0–100.0)
MONO ABS: 1.2 10*3/uL — AB (ref 0.1–1.0)
Monocytes Relative: 11 % (ref 3–12)
NEUTROS ABS: 8.8 10*3/uL — AB (ref 1.7–7.7)
Neutrophils Relative %: 82 % — ABNORMAL HIGH (ref 43–77)
Platelets: 136 10*3/uL — ABNORMAL LOW (ref 150–400)
RBC: 4.22 MIL/uL (ref 4.22–5.81)
RDW: 12.9 % (ref 11.5–15.5)
WBC: 10.6 10*3/uL — AB (ref 4.0–10.5)

## 2013-09-23 LAB — COMPREHENSIVE METABOLIC PANEL
ALT: 12 U/L (ref 0–53)
AST: 16 U/L (ref 0–37)
Albumin: 3.6 g/dL (ref 3.5–5.2)
Alkaline Phosphatase: 54 U/L (ref 39–117)
Anion gap: 14 (ref 5–15)
BILIRUBIN TOTAL: 1.2 mg/dL (ref 0.3–1.2)
BUN: 8 mg/dL (ref 6–23)
CALCIUM: 8.5 mg/dL (ref 8.4–10.5)
CHLORIDE: 99 meq/L (ref 96–112)
CO2: 22 mEq/L (ref 19–32)
Creatinine, Ser: 0.69 mg/dL (ref 0.50–1.35)
GFR calc non Af Amer: >90 mL/min (ref >90)
GLUCOSE: 250 mg/dL — AB (ref 70–99)
Potassium: 3.5 mEq/L — ABNORMAL LOW (ref 3.7–5.3)
Sodium: 135 mEq/L — ABNORMAL LOW (ref 137–147)
Total Protein: 7 g/dL (ref 6.0–8.3)

## 2013-09-23 LAB — GLUCOSE, CAPILLARY
GLUCOSE-CAPILLARY: 153 mg/dL — AB (ref 70–99)
Glucose-Capillary: 207 mg/dL — ABNORMAL HIGH (ref 70–99)

## 2013-09-23 LAB — TROPONIN I
Troponin I: <0.3 ng/mL (ref <0.30)
Troponin I: <0.3 ng/mL (ref <0.30)

## 2013-09-23 LAB — LIPASE, BLOOD: Lipase: 17 U/L (ref 11–59)

## 2013-09-23 MED ORDER — ACETAMINOPHEN 325 MG PO TABS: 650.0000 mg | ORAL_TABLET | Freq: Four times a day (QID) | ORAL | Status: DC | PRN

## 2013-09-23 MED ORDER — HYDROCODONE-ACETAMINOPHEN 5-325 MG PO TABS
1.0000 | ORAL_TABLET | ORAL | Status: DC | PRN
Start: 2013-09-23 — End: 2013-09-27
  Administered 2013-09-24: 1 via ORAL
  Administered 2013-09-24 – 2013-09-26 (×2): 2 via ORAL
  Filled 2013-09-23 (×2): qty 2
  Filled 2013-09-23: qty 1

## 2013-09-23 MED ORDER — BISACODYL 10 MG RE SUPP: 10.0000 mg | Freq: Every day | RECTAL | Status: DC | PRN

## 2013-09-23 MED ORDER — ACETAMINOPHEN 650 MG RE SUPP: 650.0000 mg | Freq: Four times a day (QID) | RECTAL | Status: DC | PRN

## 2013-09-23 MED ORDER — HYDROMORPHONE HCL PF 1 MG/ML IJ SOLN
1.0000 mg | Freq: Once | INTRAMUSCULAR | Status: AC
  Administered 2013-09-23: 1 mg via INTRAVENOUS
  Filled 2013-09-23: qty 1

## 2013-09-23 MED ORDER — HYDROMORPHONE HCL PF 1 MG/ML IJ SOLN
1.0000 mg | INTRAMUSCULAR | Status: DC | PRN
  Administered 2013-09-23 – 2013-09-24 (×5): 1 mg via INTRAVENOUS
  Filled 2013-09-23 (×8): qty 1

## 2013-09-23 MED ORDER — TRAZODONE HCL 50 MG PO TABS
25.0000 mg | ORAL_TABLET | Freq: Every evening | ORAL | Status: DC | PRN
  Administered 2013-09-24 – 2013-09-26 (×2): 25 mg via ORAL
  Filled 2013-09-23 (×2): qty 1

## 2013-09-23 MED ORDER — ALUM & MAG HYDROXIDE-SIMETH 200-200-20 MG/5ML PO SUSP: 30.0000 mL | Freq: Four times a day (QID) | ORAL | Status: DC | PRN

## 2013-09-23 MED ORDER — ONDANSETRON HCL 4 MG/2ML IJ SOLN
4.0000 mg | Freq: Four times a day (QID) | INTRAMUSCULAR | Status: DC | PRN
  Administered 2013-09-23 – 2013-09-25 (×5): 4 mg via INTRAVENOUS
  Filled 2013-09-23 (×5): qty 2

## 2013-09-23 MED ORDER — ONDANSETRON HCL 4 MG PO TABS: 4.0000 mg | ORAL_TABLET | Freq: Four times a day (QID) | ORAL | Status: DC | PRN

## 2013-09-23 MED ORDER — AMPICILLIN-SULBACTAM SODIUM 3 (2-1) G IJ SOLR
3.0000 g | Freq: Four times a day (QID) | INTRAMUSCULAR | Status: DC
  Administered 2013-09-23 – 2013-09-27 (×15): 3 g via INTRAVENOUS
  Filled 2013-09-23 (×28): qty 3

## 2013-09-23 MED ORDER — INSULIN ASPART 100 UNIT/ML ~~LOC~~ SOLN
0.0000 [IU] | Freq: Three times a day (TID) | SUBCUTANEOUS | Status: DC
  Administered 2013-09-23 – 2013-09-24 (×4): 3 [IU] via SUBCUTANEOUS
  Administered 2013-09-25 – 2013-09-26 (×2): 2 [IU] via SUBCUTANEOUS

## 2013-09-23 MED ORDER — POLYETHYLENE GLYCOL 3350 17 G PO PACK
17.0000 g | PACK | Freq: Every day | ORAL | Status: DC | PRN
  Administered 2013-09-27: 17 g via ORAL

## 2013-09-23 MED ORDER — NAPROXEN 500 MG PO TABS: 500.0000 mg | ORAL_TABLET | Freq: Two times a day (BID) | ORAL | Status: DC

## 2013-09-23 MED ORDER — ONDANSETRON HCL 4 MG/2ML IJ SOLN
4.0000 mg | Freq: Once | INTRAMUSCULAR | Status: AC
Start: 2013-09-23 — End: 2013-09-23
  Administered 2013-09-23: 4 mg via INTRAVENOUS
  Filled 2013-09-23: qty 2

## 2013-09-23 MED ORDER — POTASSIUM CHLORIDE IN NACL 20-0.9 MEQ/L-% IV SOLN
INTRAVENOUS | Status: DC
  Administered 2013-09-23 – 2013-09-25 (×3): via INTRAVENOUS

## 2013-09-23 NOTE — ED Provider Notes (Signed)
Pain ongoing in ED, was temporarily improved but not resolved, moderately tender RUQ without rebound, abnormal US GB, Surg paged. 1040  Surg never got initial page; recs [facility] admit and Arnoldo Morale will consult. 1225  Babette Relic, MD 09/24/13 2034

## 2013-09-23 NOTE — Consult Note (Signed)
Reason for Consult: Cholecystitis, cholelithiasis Referring Physician: Hospitalist  Scott Ballard is an 68 y.o. male.  HPI: Patient is a 68 year old white male who has had an episodes of epigastric pain and atypical chest pain over the past year. He presented today with a similar episode of epigastric pain, but we also had right upper quadrant abdominal pain. Ultrasound the gallbladder reveals cholecystitis with cholelithiasis. Liver enzyme tests are within normal limits. The patient is being admitted the hospital for further management treatment of his abdominal pain.  Past Medical History  Diagnosis Date  . Smoker   . Diabetes mellitus without complication   . Hypertension     Past Surgical History  Procedure Laterality Date  . Joint replacement    . Melanoma excision      History reviewed. No pertinent family history.  Social History:  reports that he has been smoking Cigars.  He does not have any smokeless tobacco history on file. He reports that he does not drink alcohol or use illicit drugs.  Allergies: No Known Allergies  Medications: I have reviewed the patient's current medications.  Results for orders placed during the hospital encounter of 09/23/13 (from the past 48 hour(s))  CBC WITH DIFFERENTIAL     Status: Abnormal   Collection Time    09/23/13  5:20 AM      Result Value Ref Range   WBC 10.6 (*) 4.0 - 10.5 K/uL   RBC 4.22  4.22 - 5.81 MIL/uL   Hemoglobin 14.0  13.0 - 17.0 g/dL   HCT 39.0  39.0 - 52.0 %   MCV 92.4  78.0 - 100.0 fL   MCH 33.2  26.0 - 34.0 pg   MCHC 35.9  30.0 - 36.0 g/dL   RDW 12.9  11.5 - 15.5 %   Platelets 136 (*) 150 - 400 K/uL   Neutrophils Relative % 82 (*) 43 - 77 %   Neutro Abs 8.8 (*) 1.7 - 7.7 K/uL   Lymphocytes Relative 7 (*) 12 - 46 %   Lymphs Abs 0.7  0.7 - 4.0 K/uL   Monocytes Relative 11  3 - 12 %   Monocytes Absolute 1.2 (*) 0.1 - 1.0 K/uL   Eosinophils Relative 0  0 - 5 %   Eosinophils Absolute 0.0  0.0 - 0.7 K/uL   Basophils  Relative 0  0 - 1 %   Basophils Absolute 0.0  0.0 - 0.1 K/uL  COMPREHENSIVE METABOLIC PANEL     Status: Abnormal   Collection Time    09/23/13  5:20 AM      Result Value Ref Range   Sodium 135 (*) 137 - 147 mEq/L   Potassium 3.5 (*) 3.7 - 5.3 mEq/L   Chloride 99  96 - 112 mEq/L   CO2 22  19 - 32 mEq/L   Glucose, Bld 250 (*) 70 - 99 mg/dL   BUN 8  6 - 23 mg/dL   Creatinine, Ser 0.69  0.50 - 1.35 mg/dL   Calcium 8.5  8.4 - 10.5 mg/dL   Total Protein 7.0  6.0 - 8.3 g/dL   Albumin 3.6  3.5 - 5.2 g/dL   AST 16  0 - 37 U/L   ALT 12  0 - 53 U/L   Alkaline Phosphatase 54  39 - 117 U/L   Total Bilirubin 1.2  0.3 - 1.2 mg/dL   GFR calc non Af Amer >90  >90 mL/min   GFR calc Af Amer >90  >90 mL/min  Comment: (NOTE)     The eGFR has been calculated using the CKD EPI equation.     This calculation has not been validated in all clinical situations.     eGFR's persistently <90 mL/min signify possible Chronic Kidney     Disease.   Anion gap 14  5 - 15  LIPASE, BLOOD     Status: None   Collection Time    09/23/13  5:20 AM      Result Value Ref Range   Lipase 17  11 - 59 U/L    Dg Chest Portable 1 View  09/22/2013   CLINICAL DATA:  CHEST PAIN  EXAM: PORTABLE CHEST - 1 VIEW  COMPARISON:  03/03/2012  FINDINGS: Lungs are clear. Heart size and mediastinal contours are within normal limits. No effusion. Visualized skeletal structures are unremarkable.  IMPRESSION: No acute cardiopulmonary disease.   Electronically Signed   By: Arne Cleveland M.D.   On: 09/22/2013 22:53   US Abdomen Limited Ruq  09/23/2013   CLINICAL DATA:  Right upper quadrant pain  EXAM: US ABDOMEN LIMITED - RIGHT UPPER QUADRANT  COMPARISON:  None.  FINDINGS: Gallbladder:  The gallbladder is distended and contains a large amount of echogenic material (some of which shadows) consistent with sludge and small stones. The gallbladder wall is thickened at 4 mm. There is no definite pericholecystic fluid. There is a positive sonographic  Murphy's sign.  Common bile duct:  Diameter: 4.7 mm  Liver:  The liver exhibits normal echotexture with no focal mass nor ductal dilation.  IMPRESSION: Findings are consistent with acute cholecystitis with cholelithiasis.   Electronically Signed   By: David  Martinique   On: 09/23/2013 08:14    ROS: See chart Blood pressure 154/73, pulse 84, temperature 97.9 F (36.6 C), temperature source Oral, resp. rate 23, height '6\' 6"'  (1.981 m), weight 99.791 kg (220 lb), SpO2 94.00%. Physical Exam: Pleasant white male who is uncomfortable Abdomen was soft was tenderness to the right upper quadrant epigastric region. No hepatosplenomegaly, masses, hernias, or rigidity are noted.  Assessment/Plan: Impression: Cholecystitis, cholelithiasis Plan: Patient be admitted to the hospital for further management of his pain and nausea. Would also like to make sure that he's not having any cardiac issues. May be on clear liquid diet. We'll start Unasyn to help treat the cholecystitis. Will subsequently undergo a laparoscopic cholecystectomy prior to discharge.  Scott Ballard A 09/23/2013, 1:01 PM

## 2013-09-23 NOTE — Discharge Instructions (Signed)
Chest Pain (Nonspecific) °It is often hard to give a specific diagnosis for the cause of chest pain. There is always a chance that your pain could be related to something serious, such as a heart attack or a blood clot in the lungs. You need to follow up with your health care provider for further evaluation. °CAUSES  °· Heartburn. °· Pneumonia or bronchitis. °· Anxiety or stress. °· Inflammation around your heart (pericarditis) or lung (pleuritis or pleurisy). °· A blood clot in the lung. °· A collapsed lung (pneumothorax). It can develop suddenly on its own (spontaneous pneumothorax) or from trauma to the chest. °· Shingles infection (herpes zoster virus). °The chest wall is composed of bones, muscles, and cartilage. Any of these can be the source of the pain. °· The bones can be bruised by injury. °· The muscles or cartilage can be strained by coughing or overwork. °· The cartilage can be affected by inflammation and become sore (costochondritis). °DIAGNOSIS  °Lab tests or other studies may be needed to find the cause of your pain. Your health care provider may have you take a test called an ambulatory electrocardiogram (ECG). An ECG records your heartbeat patterns over a 24-hour period. You may also have other tests, such as: °· Transthoracic echocardiogram (TTE). During echocardiography, sound waves are used to evaluate how blood flows through your heart. °· Transesophageal echocardiogram (TEE). °· Cardiac monitoring. This allows your health care provider to monitor your heart rate and rhythm in real time. °· Holter monitor. This is a portable device that records your heartbeat and can help diagnose heart arrhythmias. It allows your health care provider to track your heart activity for several days, if needed. °· Stress tests by exercise or by giving medicine that makes the heart beat faster. °TREATMENT  °· Treatment depends on what may be causing your chest pain. Treatment may include: °¨ Acid blockers for  heartburn. °¨ Anti-inflammatory medicine. °¨ Pain medicine for inflammatory conditions. °¨ Antibiotics if an infection is present. °· You may be advised to change lifestyle habits. This includes stopping smoking and avoiding alcohol, caffeine, and chocolate. °· You may be advised to keep your head raised (elevated) when sleeping. This reduces the chance of acid going backward from your stomach into your esophagus. °Most of the time, nonspecific chest pain will improve within 2-3 days with rest and mild pain medicine.  °HOME CARE INSTRUCTIONS  °· If antibiotics were prescribed, take them as directed. Finish them even if you start to feel better. °· For the next few days, avoid physical activities that bring on chest pain. Continue physical activities as directed. °· Do not use any tobacco products, including cigarettes, chewing tobacco, or electronic cigarettes. °· Avoid drinking alcohol. °· Only take medicine as directed by your health care provider. °· Follow your health care provider's suggestions for further testing if your chest pain does not go away. °· Keep any follow-up appointments you made. If you do not go to an appointment, you could develop lasting (chronic) problems with pain. If there is any problem keeping an appointment, call to reschedule. °SEEK MEDICAL CARE IF:  °· Your chest pain does not go away, even after treatment. °· You have a rash with blisters on your chest. °· You have a fever. °SEEK IMMEDIATE MEDICAL CARE IF:  °· You have increased chest pain or pain that spreads to your arm, neck, jaw, back, or abdomen. °· You have shortness of breath. °· You have an increasing cough, or you cough   up blood. °· You have severe back or abdominal pain. °· You feel nauseous or vomit. °· You have severe weakness. °· You faint. °· You have chills. °This is an emergency. Do not wait to see if the pain will go away. Get medical help at once. Call your local emergency services (911 in [place] Do not drive  yourself to the hospital. °MAKE SURE YOU:  °· Understand these instructions. °· Will watch your condition. °· Will get help right away if you are not doing well or get worse. °Document Released: 10/20/2004 Document Revised: 01/15/2013 Document Reviewed: 08/16/2007 °Exit[employer]® Patient Information ©2015 [employer]. This information is not intended to replace advice given to you by your health care provider. Make sure you discuss any questions you have with your health care provider. ° °Naproxen and naproxen sodium oral immediate-release tablets °What is this medicine? °NAPROXEN (na PROX en) is a non-steroidal anti-inflammatory drug (NSAID). It is used to reduce swelling and to treat pain. This medicine may be used for dental pain, headache, or painful monthly periods. It is also used for painful joint and muscular problems such as arthritis, tendinitis, bursitis, and gout. °This medicine may be used for other purposes; ask your health care provider or pharmacist if you have questions. °COMMON BRAND NAME(S): Aflaxen, Aleve, Aleve Arthritis, All Day Relief, Anaprox, Anaprox DS, Naprosyn °What should I tell my health care provider before I take this medicine? °They need to know if you have any of these conditions: °-asthma °-cigarette smoker °-drink more than 3 alcohol containing drinks a day °-heart disease or circulation problems such as heart failure or leg edema (fluid retention) °-high blood pressure °-kidney disease °-liver disease °-stomach bleeding or ulcers °-an unusual or allergic reaction to naproxen, aspirin, other NSAIDs, other medicines, foods, dyes, or preservatives °-pregnant or trying to get pregnant °-breast-feeding °How should I use this medicine? °Take this medicine by mouth with a glass of water. Follow the directions on the prescription label. Take it with food if your stomach gets upset. Try to not lie down for at least 10 minutes after you take it. Take your medicine at regular intervals. Do not  take your medicine more often than directed. Long-term, continuous use may increase the risk of heart attack or stroke. °A special MedGuide will be given to you by the pharmacist with each prescription and refill. Be sure to read this information carefully each time. °Talk to your pediatrician regarding the use of this medicine in children. Special care may be needed. °Overdosage: If you think you have taken too much of this medicine contact a poison control center or emergency room at once. °NOTE: This medicine is only for you. Do not share this medicine with others. °What if I miss a dose? °If you miss a dose, take it as soon as you can. If it is almost time for your next dose, take only that dose. Do not take double or extra doses. °What may interact with this medicine? °-alcohol °-aspirin °-cidofovir °-diuretics °-lithium °-methotrexate °-other drugs for inflammation like ketorolac or prednisone °-pemetrexed °-probenecid °-warfarin °This list may not describe all possible interactions. Give your health care provider a list of all the medicines, herbs, non-prescription drugs, or dietary supplements you use. Also tell them if you smoke, drink alcohol, or use illegal drugs. Some items may interact with your medicine. °What should I watch for while using this medicine? °Tell your doctor or health care professional if your pain does not get better. Talk to your   doctor before taking another medicine for pain. Do not treat yourself. °This medicine does not prevent heart attack or stroke. In fact, this medicine may increase the chance of a heart attack or stroke. The chance may increase with longer use of this medicine and in people who have heart disease. If you take aspirin to prevent heart attack or stroke, talk with your doctor or health care professional. °Do not take other medicines that contain aspirin, ibuprofen, or naproxen with this medicine. Side effects such as stomach upset, nausea, or ulcers may be more  likely to occur. Many medicines available without a prescription should not be taken with this medicine. °This medicine can cause ulcers and bleeding in the stomach and intestines at any time during treatment. Do not smoke cigarettes or drink alcohol. These increase irritation to your stomach and can make it more susceptible to damage from this medicine. Ulcers and bleeding can happen without warning symptoms and can cause death. °You may get drowsy or dizzy. Do not drive, use machinery, or do anything that needs mental alertness until you know how this medicine affects you. Do not stand or sit up quickly, especially if you are an older patient. This reduces the risk of dizzy or fainting spells. °This medicine can cause you to bleed more easily. Try to avoid damage to your teeth and gums when you brush or floss your teeth. °What side effects may I notice from receiving this medicine? °Side effects that you should report to your doctor or health care professional as soon as possible: °-black or bloody stools, blood in the urine or vomit °-blurred vision °-chest pain °-difficulty breathing or wheezing °-nausea or vomiting °-severe stomach pain °-skin rash, skin redness, blistering or peeling skin, hives, or itching °-slurred speech or weakness on one side of the body °-swelling of eyelids, throat, lips °-unexplained weight gain or swelling °-unusually weak or tired °-yellowing of eyes or skin °Side effects that usually do not require medical attention (report to your doctor or health care professional if they continue or are bothersome): °-constipation °-headache °-heartburn °This list may not describe all possible side effects. Call your doctor for medical advice about side effects. You may report side effects to FDA at 1-800-FDA-1088. °Where should I keep my medicine? °Keep out of the reach of children. °Store at room temperature between 15 and 30 degrees C (59 and 86 degrees F). Keep container tightly closed. Throw  away any unused medicine after the expiration date. °NOTE: This sheet is a summary. It may not cover all possible information. If you have questions about this medicine, talk to your doctor, pharmacist, or health care provider. °© 2015, [employer]. (2009-01-12 20:10:16) ° °

## 2013-09-23 NOTE — ED Notes (Signed)
Informed EDP that Dr. Arnoldo Morale was in surgery.  Message left with OR staff to have him call when available.

## 2013-09-23 NOTE — H&P (Signed)
[facility] History and Physical  Yassine Brunsman LGX:[id number] DOB: 20-Nov-1945 DOA: 09/23/2013  Referring physician:  PCP: Odette Fraction, MD   Chief Complaint: abdominal pain  HPI: Scott Ballard is a very pleasant 68 y.o. male with past medical history that includes hypertension, diabetes diet controlled presents to the emergency department with the chief complaint of intermittent, persistent, worsening right upper quadrant abdominal pain with nausea. Workup in the emergency department reveals cholelithiasis with acute cholecystitis.  Patient reports he has experienced intermittent abdominal pain for the last several days. He came to the emergency department last night was discharged with pain medicine but the pain worsened so he came back early this morning. He describes the pain as cramp-like sharp and located in the upper left-hand quadrant with occasional radiation to the back. Associated symptoms include nausea without vomiting. He denies any shortness of breath fever chills diaphoresis. He denies any constipation diarrhea melena or bright red blood per rectum.  Complete metabolic panel in the emergency department revealed mild hyponatremia mild hypokalemia and hyperglycemia. In addition a complete blood count with a white count of 10.6 and relative neutrophils 82%. Chest x-ray with no acute cardiopulmonary process. EKG with sinus rhythm and prolonged PR interval and likely LVH. Abdominal ultrasound reveals findings are consistent with acute cholecystitis with cholelithiasis.  In the emergency department he is provided with 1 mg of Dilaudid and 4 mg of Zofran. At the time of my exam he is hemodynamically stable afebrile and not hypoxic.   Review of Systems:  10 point review of systems completed and all systems are negative except as indicated by the history of present illness  Past Medical History  Diagnosis Date  . Smoker   . Diabetes mellitus without complication   .  Hypertension    Past Surgical History  Procedure Laterality Date  . Joint replacement    . Melanoma excision     Social History:  reports that he has been smoking Cigars.  He does not have any smokeless tobacco history on file. He reports that he does not drink alcohol or use illicit drugs. 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 He is married and lives with his wife No Known Allergies  History reviewed. No pertinent family history. family medical history reviewed and is noncontributory to the admission of this gentleman  Prior to Admission medications   Medication Sig Start Date End Date Taking? Authorizing Provider  aspirin 81 MG tablet Take 81 mg by mouth daily.   Yes Historical Provider, MD  CINNAMON PO Take 1 capsule by mouth every other day.   Yes Historical Provider, MD  Cyanocobalamin (VITAMIN B 12 PO) Take 1 tablet by mouth daily.   Yes Historical Provider, MD  lisinopril (PRINIVIL,ZESTRIL) 20 MG tablet Take 20 mg by mouth daily.   Yes Historical Provider, MD  naproxen sodium (ANAPROX) 220 MG tablet Take 440 mg by mouth daily as needed (pain).   Yes Historical Provider, MD  oxyCODONE-acetaminophen (PERCOCET/ROXICET) 5-325 MG per tablet Take 1 tablet by mouth every 4 (four) hours as needed for severe pain.   Yes Historical Provider, MD  ranitidine (ZANTAC) 150 MG tablet Take 150 mg by mouth daily as needed for heartburn.   Yes Historical Provider, MD  naproxen (NAPROSYN) 500 MG tablet Take 1 tablet (500 mg total) by mouth 2 (two) times daily. 09/07/46   Delora Fuel, MD   Physical Exam: Filed Vitals:   09/23/13 1130 09/23/13 1200 09/23/13 1230 09/23/13 1300  BP: 155/76 152/75 154/73 153/79  Pulse: 79 85 84 81  Temp:      TempSrc:      Resp: 24 24 23 22   Height:      Weight:      SpO2: 93% 93% 94% 96%    Wt Readings from Last 3 Encounters:  09/23/13 99.791 kg (220 lb)  09/22/13 99.791 kg (220 lb)  08/12/13 103.42 kg (228 lb)    General:   Appears calm and somewhat uncomfortable Eyes: PERRL, normal lids, irises & conjunctiva ENT: grossly normal hearing, he does membranes of his mouth are pink slightly dry Neck: no LAD, masses or thyromegaly Cardiovascular: RRR, no m/r/g. No LE edema. Respiratory: CTA bilaterally, no w/r/r. Normal respiratory effort. Abdomen: soft, nondistended. Bowel sounds are sluggish diffuse tenderness particularly on the right. No mass organomegaly noted some guarding Skin: no rash or induration seen on limited exam Musculoskeletal: grossly normal tone BUE/BLE Psychiatric: grossly normal mood and affect, speech fluent and appropriate Neurologic: grossly non-focal.          Labs on Admission:  Basic Metabolic Panel:  Recent Labs Lab 09/22/13 2245 09/23/13 0520  NA 136* 135*  K 3.9 3.5*  CL 101 99  CO2 23 22  GLUCOSE 153* 250*  BUN 11 8  CREATININE 0.86 0.69  CALCIUM 8.7 8.5   Liver Function Tests:  Recent Labs Lab 09/23/13 0520  AST 16  ALT 12  ALKPHOS 54  BILITOT 1.2  PROT 7.0  ALBUMIN 3.6    Recent Labs Lab 09/23/13 0520  LIPASE 17   No results found for this basename: AMMONIA,  in the last 168 hours CBC:  Recent Labs Lab 09/22/13 2245 09/23/13 0520  WBC 9.5 10.6*  NEUTROABS 7.4 8.8*  HGB 13.4 14.0  HCT 37.8* 39.0  MCV 93.3 92.4  PLT 121* 136*   Cardiac Enzymes:  Recent Labs Lab 09/22/13 2245  TROPONINI <0.30    BNP (last 3 results) No results found for this basename: PROBNP,  in the last 8760 hours CBG: No results found for this basename: GLUCAP,  in the last 168 hours  Radiological Exams on Admission: Dg Chest Portable 1 View  09/22/2013   CLINICAL DATA:  CHEST PAIN  EXAM: PORTABLE CHEST - 1 VIEW  COMPARISON:  03/03/2012  FINDINGS: Lungs are clear. Heart size and mediastinal contours are within normal limits. No effusion. Visualized skeletal structures are unremarkable.  IMPRESSION: No acute cardiopulmonary disease.   Electronically Signed   By: Arne Cleveland M.D.   On: 09/22/2013 22:53   US Abdomen Limited Ruq  09/23/2013   CLINICAL DATA:  Right upper quadrant pain  EXAM: US ABDOMEN LIMITED - RIGHT UPPER QUADRANT  COMPARISON:  None.  FINDINGS: Gallbladder:  The gallbladder is distended and contains a large amount of echogenic material (some of which shadows) consistent with sludge and small stones. The gallbladder wall is thickened at 4 mm. There is no definite pericholecystic fluid. There is a positive sonographic Murphy's sign.  Common bile duct:  Diameter: 4.7 mm  Liver:  The liver exhibits normal echotexture with no focal mass nor ductal dilation.  IMPRESSION: Findings are consistent with acute cholecystitis with cholelithiasis.   Electronically Signed   By: David  Martinique   On: 09/23/2013 08:14    EKG: Independently reviewed NSR possible LVH  Assessment/Plan Principal Problem:   Cholecystitis, acute: Will admit to medical floor. Abdominal ultrasound as above. Lipase within normal limits. Troponin last night negative. Will cycle troponins  and rule out cardiac issues. He is afebrile but does have a mild leukocytosis. Does not appear toxic. Will start Unasyn. Will provide pain medicine and nausea medicine as needed. Provide clear liquid diet with n.p.o. past midnight. Dr. Arnoldo Morale with surgery consulted while patient was in the emergency department and opines patient will need laparoscopic cholecystectomy before discharge.  Active Problems: Leukocytosis: Mild. He is afebrile and nontoxic appearing. Unasyn per pharmacy. Will get blood cultures if she spikes a temperature. Will monitor closely    Hyponatremia: Mild. Likely related to #1 and decreased by mouth intake. Will provide IV fluids. Recheck in the a.m.    Hypokalemia: Will supplement intravenously. Will recheck in the morning    Diabetes mellitus without complication: Diet controlled at home. Will obtain an A1c. Provide sliding scale insulin for optimal control.    Hypertension:  Controlled. Home medications include lisinopril. I will hold this for now.    Smoker: Cessation counseling offered.    Symptomatic cholelithiasis: See 1    Dr Arnoldo Morale general surgery  Code Status: full DVT Prophylaxis: Family Communication: wife at bedside Disposition Plan: home when ready  Time spent: 18 minutes  [facility] Pager [phone]  **Disclaimer: This note may have been dictated with voice recognition software. Similar sounding words can inadvertently be transcribed and this note may contain transcription errors which may not have been corrected upon publication of note.**

## 2013-09-23 NOTE — H&P (Signed)
Patient seen and examined. Note reviewed  He's been admitted to the hospital with right upper quadrant pain. Abdominal ultrasound indicates cholecystitis. He's been started on intravenous antibiotics. General surgery has been consulted and will plan on cholecystectomy. We'll cycle cardiac markers to rule out ACS, although I feel that a cardiac etiology would be very unlikely. He is on clear liquids for now.

## 2013-09-23 NOTE — ED Provider Notes (Signed)
CSN: [id number]     Arrival date & time 09/23/13  0431 History   First MD Initiated Contact with Patient 09/23/13 [phone]     Chief Complaint  Patient presents with  . Chest Pain    here earlier for same and discharged home.  pain never went away.      (Consider location/radiation/quality/duration/timing/severity/associated sxs/prior Treatment) Patient is a 68 y.o. male presenting with chest pain. The history is provided by the patient.  Chest Pain He comes in complaining of severe, sharp pain in the right lower chest with some radiation to the side and back. It has been present for the last 2-3 days, but got significantly worse today. He had been seen in the ED earlier and workup was unremarkable. He had significant pain relief with a dose of Toradol in the ED, but pain got significantly worse after going home. He states the pain seemed to get worse after eating some chicken nuggets. There is mild associated nausea but no vomiting. He denies dyspnea or sweating. He currently rates pain at 10/10. Nothing seems to make it better or worse.  Past Medical History  Diagnosis Date  . Smoker   . Diabetes mellitus without complication   . Hypertension    Past Surgical History  Procedure Laterality Date  . Joint replacement    . Melanoma excision     History reviewed. No pertinent family history. History  Substance Use Topics  . Smoking status: Current Every Day Smoker    Types: Cigars  . Smokeless tobacco: Not on file  . Alcohol Use: No    Review of Systems  Cardiovascular: Positive for chest pain.  All other systems reviewed and are negative.     Allergies  Review of patient's allergies indicates no known allergies.  Home Medications   Prior to Admission medications   Medication Sig Start Date End Date Taking? Authorizing Provider  aspirin 81 MG tablet Take 81 mg by mouth daily.    Historical Provider, MD  CINNAMON PO Take 1 capsule by mouth every other day.    Historical  Provider, MD  Cyanocobalamin (VITAMIN B 12 PO) Take 1 tablet by mouth daily.    Historical Provider, MD  lisinopril (PRINIVIL,ZESTRIL) 20 MG tablet Take 20 mg by mouth daily.    Historical Provider, MD  naproxen (NAPROSYN) 500 MG tablet Take 1 tablet (500 mg total) by mouth 2 (two) times daily. 3/81/01   Delora Fuel, MD  oxyCODONE-acetaminophen (PERCOCET/ROXICET) 5-325 MG per tablet Take 1 tablet by mouth every 4 (four) hours as needed for severe pain.    Historical Provider, MD   BP 161/75  Pulse 60  Temp(Src) 97.9 F (36.6 C) (Oral)  Resp 21  Ht 6\' 6"  (1.981 m)  Wt 220 lb (99.791 kg)  BMI 25.43 kg/m2  SpO2 98% Physical Exam  Nursing note and vitals reviewed.  68 year old male,  who appears uncomfortable, but isin no acute distress. Vital signs are  significant for hypertension and borderline tachypnea. Oxygen saturation is 98%, which is normal. Head is normocephalic and atraumatic. PERRLA, EOMI. Oropharynx is clear. Neck is nontender and supple without adenopathy or JVD. Back is nontender and there is no CVA tenderness. Lungs are clear without rales, wheezes, or rhonchi. Chest is nontender. Heart has regular rate and rhythm without murmur. Abdomen is soft, flat,  with moderate tenderness along the right costal margin in the epigastric area. There is no rebound or guarding. There is a plus/minus Murphy sign present.  There are no masses or hepatosplenomegaly and peristalsis is hypoactive. Extremities have no cyanosis or edema, full range of motion is present. Skin is warm and dry without rash. Neurologic: Mental status is normal, cranial nerves are intact, there are no motor or sensory deficits.  ED Course  Procedures (including critical care time) Labs Review Results for orders placed during the hospital encounter of 09/23/13  CBC WITH DIFFERENTIAL      Result Value Ref Range   WBC 10.6 (*) 4.0 - 10.5 K/uL   RBC 4.22  4.22 - 5.81 MIL/uL   Hemoglobin 14.0  13.0 - 17.0 g/dL    HCT 39.0  39.0 - 52.0 %   MCV 92.4  78.0 - 100.0 fL   MCH 33.2  26.0 - 34.0 pg   MCHC 35.9  30.0 - 36.0 g/dL   RDW 12.9  11.5 - 15.5 %   Platelets 136 (*) 150 - 400 K/uL   Neutrophils Relative % 82 (*) 43 - 77 %   Neutro Abs 8.8 (*) 1.7 - 7.7 K/uL   Lymphocytes Relative 7 (*) 12 - 46 %   Lymphs Abs 0.7  0.7 - 4.0 K/uL   Monocytes Relative 11  3 - 12 %   Monocytes Absolute 1.2 (*) 0.1 - 1.0 K/uL   Eosinophils Relative 0  0 - 5 %   Eosinophils Absolute 0.0  0.0 - 0.7 K/uL   Basophils Relative 0  0 - 1 %   Basophils Absolute 0.0  0.0 - 0.1 K/uL  COMPREHENSIVE METABOLIC PANEL      Result Value Ref Range   Sodium 135 (*) 137 - 147 mEq/L   Potassium 3.5 (*) 3.7 - 5.3 mEq/L   Chloride 99  96 - 112 mEq/L   CO2 22  19 - 32 mEq/L   Glucose, Bld 250 (*) 70 - 99 mg/dL   BUN 8  6 - 23 mg/dL   Creatinine, Ser 0.69  0.50 - 1.35 mg/dL   Calcium 8.5  8.4 - 10.5 mg/dL   Total Protein 7.0  6.0 - 8.3 g/dL   Albumin 3.6  3.5 - 5.2 g/dL   AST 16  0 - 37 U/L   ALT 12  0 - 53 U/L   Alkaline Phosphatase 54  39 - 117 U/L   Total Bilirubin 1.2  0.3 - 1.2 mg/dL   GFR calc non Af Amer >90  >90 mL/min   GFR calc Af Amer >90  >90 mL/min   Anion gap 14  5 - 15  LIPASE, BLOOD      Result Value Ref Range   Lipase 17  11 - 59 U/L   EKG Interpretation   Date/Time:  Monday September 23 2013 04:42:37 EDT Ventricular Rate:  60 PR Interval:  247 QRS Duration: 115 QT Interval:  415 QTC Calculation: 415 R Axis:   -54 Text Interpretation:  Sinus rhythm Prolonged PR interval Left anterior  fascicular block Probable left ventricular hypertrophy When compared with  ECG of 09/22/2013, No significant change was found Confirmed by [facility]  MD,  Nishka Heide ([id number]) on 09/23/2013 4:51:54 AM      MDM   Final diagnoses:  RUQ pain    Right upper abdominal pain. I am concerned about possible biliary colic. Limited bedside ultrasound was attempted but I could not visualize his gallbladder. He'll be given hydromorphone for  pain. Earlier this evening, and basic metabolic panel done but not a comprehensive metabolic panel. Content of metabolic panel be  checked as well as lipase. Anticipate sending him for a gallbladder ultrasound.  Laboratory workup shows slight increase in WBC compared with earlier. Liver functions are normal. He got good pain relief with intravenous hydromorphone. Ultrasound is then ordered in case will be signed out to Dr. Stevie Kern.  Delora Fuel, MD 51/83/43 [id number]

## 2013-09-23 NOTE — Progress Notes (Signed)
ANTIBIOTIC CONSULT NOTE - INITIAL  Pharmacy Consult for Unasyn Indication: intra -abdominal infection  No Known Allergies  Patient Measurements: Height: 6\' 6"  (198.1 cm) Weight: 220 lb (99.791 kg) IBW/kg (Calculated) : 91.4  Vital Signs: Temp: 97.9 F (36.6 C) (08/31 0445) Temp src: Oral (08/31 0445) BP: 153/79 mmHg (08/31 1300) Pulse Rate: 81 (08/31 1300) Intake/Output from previous day:   Intake/Output from this shift:    Labs:  Recent Labs  09/22/13 2245 09/23/13 0520  WBC 9.5 10.6*  HGB 13.4 14.0  PLT 121* 136*  CREATININE 0.86 0.69   Estimated Creatinine Clearance: 111.1 ml/min (by C-G formula based on Cr of 0.69). No results found for this basename: VANCOTROUGH, VANCOPEAK, VANCORANDOM, GENTTROUGH, GENTPEAK, GENTRANDOM, TOBRATROUGH, TOBRAPEAK, TOBRARND, AMIKACINPEAK, AMIKACINTROU, AMIKACIN,  in the last 72 hours   Microbiology: No results found for this or any previous visit (from the past 720 hour(s)).  Medical History: Past Medical History  Diagnosis Date  . Smoker   . Diabetes mellitus without complication   . Hypertension    Anti-infectives   Start     Dose/Rate Route Frequency Ordered Stop   09/23/13 1600  Ampicillin-Sulbactam (UNASYN) 3 g in sodium chloride 0.9 % 100 mL IVPB     3 g 100 mL/hr over 60 Minutes Intravenous Every 6 hours 09/23/13 1406        Assessment: 68yo male admitted c/o abdominal pain.  Workup in ED reveals cholelithiasis with acute cholecystitis.  Asked to initiate Unasyn. Pt has good renal fxn.  Estimated Creatinine Clearance: 111.1 ml/min (by C-G formula based on Cr of 0.69).  Goal of Therapy:  Eradicate infection.  Plan:  Unasyn 3gm IV q6hrs Monitor labs, renal fxn, and cultures  Hart Robinsons A 09/23/2013,2:41 PM

## 2013-09-24 ENCOUNTER — Inpatient Hospital Stay (HOSPITAL_COMMUNITY): Payer: 59

## 2013-09-24 ENCOUNTER — Encounter (HOSPITAL_COMMUNITY): Payer: Self-pay | Admitting: Gastroenterology

## 2013-09-24 DIAGNOSIS — D72829 Elevated white blood cell count, unspecified: Secondary | ICD-10-CM

## 2013-09-24 LAB — COMPREHENSIVE METABOLIC PANEL
ALT: 47 U/L (ref 0–53)
AST: 43 U/L — AB (ref 0–37)
Albumin: 2.9 g/dL — ABNORMAL LOW (ref 3.5–5.2)
Alkaline Phosphatase: 86 U/L (ref 39–117)
Anion gap: 11 (ref 5–15)
BUN: 19 mg/dL (ref 6–23)
CO2: 24 mEq/L (ref 19–32)
CREATININE: 0.85 mg/dL (ref 0.50–1.35)
Calcium: 8.5 mg/dL (ref 8.4–10.5)
Chloride: 99 mEq/L (ref 96–112)
GFR, EST NON AFRICAN AMERICAN: 86 mL/min — AB (ref >90)
GLUCOSE: 203 mg/dL — AB (ref 70–99)
POTASSIUM: 4.6 meq/L (ref 3.7–5.3)
SODIUM: 134 meq/L — AB (ref 137–147)
Total Bilirubin: 4.6 mg/dL — ABNORMAL HIGH (ref 0.3–1.2)
Total Protein: 6.5 g/dL (ref 6.0–8.3)

## 2013-09-24 LAB — CBC
HEMATOCRIT: 43.4 % (ref 39.0–52.0)
Hemoglobin: 15.3 g/dL (ref 13.0–17.0)
MCH: 33.1 pg (ref 26.0–34.0)
MCHC: 35.3 g/dL (ref 30.0–36.0)
MCV: 93.9 fL (ref 78.0–100.0)
Platelets: 170 10*3/uL (ref 150–400)
RBC: 4.62 MIL/uL (ref 4.22–5.81)
RDW: 13.2 % (ref 11.5–15.5)
WBC: 19.2 10*3/uL — ABNORMAL HIGH (ref 4.0–10.5)

## 2013-09-24 LAB — GLUCOSE, CAPILLARY
GLUCOSE-CAPILLARY: 160 mg/dL — AB (ref 70–99)
Glucose-Capillary: 129 mg/dL — ABNORMAL HIGH (ref 70–99)
Glucose-Capillary: 155 mg/dL — ABNORMAL HIGH (ref 70–99)
Glucose-Capillary: 149 mg/dL — ABNORMAL HIGH (ref 70–99)

## 2013-09-24 LAB — SURGICAL PCR SCREEN
MRSA, PCR: NEGATIVE
Staphylococcus aureus: NEGATIVE

## 2013-09-24 LAB — HEMOGLOBIN A1C
Hgb A1c MFr Bld: 6.1 % — ABNORMAL HIGH (ref <5.7)
Mean Plasma Glucose: 128 mg/dL — ABNORMAL HIGH (ref <117)

## 2013-09-24 LAB — BILIRUBIN, DIRECT: Bilirubin, Direct: 3.5 mg/dL — ABNORMAL HIGH (ref 0.0–0.3)

## 2013-09-24 MED ORDER — GADOBENATE DIMEGLUMINE 529 MG/ML IV SOLN
20.0000 mL | Freq: Once | INTRAVENOUS | Status: AC | PRN
  Administered 2013-09-24: 20 mL via INTRAVENOUS

## 2013-09-24 NOTE — Progress Notes (Signed)
Pt went to the bathroom and while he was sitting on the toilet he found a tick in his crotch area. He had pulled it off and threw it in the toilet by the time I made it into the bathroom but he does have a red raised area where he pulled it off.

## 2013-09-24 NOTE — Progress Notes (Signed)
  Subjective: Still with right-sided abdominal pain.  Objective: Vital signs in last 24 hours: Temp:  [97.6 F (36.4 C)-98.6 F (37 C)] 98.5 F (36.9 C) (09/01 0522) Pulse Rate:  [65-88] 80 (09/01 0522) Resp:  [18-27] 20 (09/01 0522) BP: (125-156)/(53-79) 125/66 mmHg (09/01 0522) SpO2:  [92 %-96 %] 95 % (09/01 0522) Weight:  [105 kg (231 lb 7.7 oz)] 105 kg (231 lb 7.7 oz) (08/31 1500) Last BM Date: 09/22/13  Intake/Output from previous day:   Intake/Output this shift:    General appearance: alert, cooperative and fatigued GI: Soft with tenderness noted right upper quadrant to palpation. no rigidity noted.  Lab Results:   Recent Labs  09/23/13 0520 09/24/13 0638  WBC 10.6* 19.2*  HGB 14.0 15.3  HCT 39.0 43.4  PLT 136* 170   BMET  Recent Labs  09/23/13 0520 09/24/13 0638  NA 135* 134*  K 3.5* 4.6  CL 99 99  CO2 22 24  GLUCOSE 250* 203*  BUN 8 19  CREATININE 0.69 0.85  CALCIUM 8.5 8.5   PT/INR No results found for this basename: LABPROT, INR,  in the last 72 hours  Studies/Results: Dg Chest Portable 1 View  09/22/2013   CLINICAL DATA:  CHEST PAIN  EXAM: PORTABLE CHEST - 1 VIEW  COMPARISON:  03/03/2012  FINDINGS: Lungs are clear. Heart size and mediastinal contours are within normal limits. No effusion. Visualized skeletal structures are unremarkable.  IMPRESSION: No acute cardiopulmonary disease.   Electronically Signed   By: Arne Cleveland M.D.   On: 09/22/2013 22:53   US Abdomen Limited Ruq  09/23/2013   CLINICAL DATA:  Right upper quadrant pain  EXAM: US ABDOMEN LIMITED - RIGHT UPPER QUADRANT  COMPARISON:  None.  FINDINGS: Gallbladder:  The gallbladder is distended and contains a large amount of echogenic material (some of which shadows) consistent with sludge and small stones. The gallbladder wall is thickened at 4 mm. There is no definite pericholecystic fluid. There is a positive sonographic Murphy's sign.  Common bile duct:  Diameter: 4.7 mm  Liver:   The liver exhibits normal echotexture with no focal mass nor ductal dilation.  IMPRESSION: Findings are consistent with acute cholecystitis with cholelithiasis.   Electronically Signed   By: David  Martinique   On: 09/23/2013 08:14    Anti-infectives: Anti-infectives   Start     Dose/Rate Route Frequency Ordered Stop   09/23/13 1600  Ampicillin-Sulbactam (UNASYN) 3 g in sodium chloride 0.9 % 100 mL IVPB     3 g 100 mL/hr over 60 Minutes Intravenous Every 6 hours 09/23/13 1406        Assessment/Plan: Impression: Cholecystitis, cholelithiasis. Bilirubin has now increased. Will get MRCP to check for choledocholithiasis. Further management is pending those results.  LOS: 1 day    Georgi Navarrete A 09/24/2013

## 2013-09-24 NOTE — Progress Notes (Signed)
[facility] PROGRESS NOTE  Scott Ballard TIW:[id number] DOB: 1945-06-27 DOA: 09/23/2013 PCP: Odette Fraction, MD  Assessment/Plan: Cholecystitis, acute: management per surgery.  Troponin negative x3. Worsening leukocytosis increase in bilirubin but afebrile. Reports feeling better this am.  He is afebrile but does have a mild leukocytosis. Does not appear toxic. Continue Unasyn day #2. MRCP today and surgery likely tomorrow.  Active Problems:  Leukocytosis: worsening. He remains afebrile and nontoxic appearing. Unasyn day #2.   Hyponatremia: Mild and stable. Likely related to #1 and decreased by mouth intake. Will provide IV fluids. Recheck in the a.m.   Hypokalemia: resolved   Diabetes mellitus without complication: CBG range 250-539. Diet controlled at home. Will obtain an A1c. Provide sliding scale insulin for optimal control.   Hypertension: Controlled.    Smoker: Cessation counseling offered.   Symptomatic cholelithiasis: See 1  Code Status: full Family Communication: none present Disposition Plan: home when ready   Consultants:  GI  General surgery  Procedures:  MRCP  Antibiotics:  Unasyn 09/23/13>>  HPI/Subjective: Awake alert reading the paper. Reports feeling much better. Denies nausea or pain  Objective: Filed Vitals:   09/24/13 0522  BP: 125/66  Pulse: 80  Temp: 98.5 F (36.9 C)  Resp: 20   No intake or output data in the 24 hours ending 09/24/13 0912 Filed Weights   09/23/13 0445 09/23/13 1500  Weight: 99.791 kg (220 lb) 105 kg (231 lb 7.7 oz)    Exam:   General:  Well nourished appears comfortable  Cardiovascular: RRR No MGR No LE edema  Respiratory: normal effort BS clear bilaterally no wheeze  Abdomen: round but soft. +BS mild tenderness in upper right quadrant  Musculoskeletal: joints without swelling/erythema  Data Reviewed: Basic Metabolic Panel:  Recent Labs Lab 09/22/13 2245 09/23/13 0520 09/24/13 0638  NA 136*  135* 134*  K 3.9 3.5* 4.6  CL 101 99 99  CO2 23 22 24   GLUCOSE 153* 250* 203*  BUN 11 8 19   CREATININE 0.86 0.69 0.85  CALCIUM 8.7 8.5 8.5   Liver Function Tests:  Recent Labs Lab 09/23/13 0520 09/24/13 0638  AST 16 43*  ALT 12 47  ALKPHOS 54 86  BILITOT 1.2 4.6*  PROT 7.0 6.5  ALBUMIN 3.6 2.9*    Recent Labs Lab 09/23/13 0520  LIPASE 17   No results found for this basename: AMMONIA,  in the last 168 hours CBC:  Recent Labs Lab 09/22/13 2245 09/23/13 0520 09/24/13 0638  WBC 9.5 10.6* 19.2*  NEUTROABS 7.4 8.8*  --   HGB 13.4 14.0 15.3  HCT 37.8* 39.0 43.4  MCV 93.3 92.4 93.9  PLT 121* 136* 170   Cardiac Enzymes:  Recent Labs Lab 09/22/13 2245 09/23/13 0520 09/23/13 1630 09/23/13 2146  TROPONINI <0.30 <0.30 <0.30 <0.30   BNP (last 3 results) No results found for this basename: PROBNP,  in the last 8760 hours CBG:  Recent Labs Lab 09/23/13 1514 09/23/13 2039 09/24/13 0726  GLUCAP 153* 207* 160*    No results found for this or any previous visit (from the past 240 hour(s)).   Studies: Dg Chest Portable 1 View  09/22/2013   CLINICAL DATA:  CHEST PAIN  EXAM: PORTABLE CHEST - 1 VIEW  COMPARISON:  03/03/2012  FINDINGS: Lungs are clear. Heart size and mediastinal contours are within normal limits. No effusion. Visualized skeletal structures are unremarkable.  IMPRESSION: No acute cardiopulmonary disease.   Electronically Signed   By: Arne Cleveland M.D.   On:  09/22/2013 22:53   US Abdomen Limited Ruq  09/23/2013   CLINICAL DATA:  Right upper quadrant pain  EXAM: US ABDOMEN LIMITED - RIGHT UPPER QUADRANT  COMPARISON:  None.  FINDINGS: Gallbladder:  The gallbladder is distended and contains a large amount of echogenic material (some of which shadows) consistent with sludge and small stones. The gallbladder wall is thickened at 4 mm. There is no definite pericholecystic fluid. There is a positive sonographic Murphy's sign.  Common bile duct:  Diameter: 4.7  mm  Liver:  The liver exhibits normal echotexture with no focal mass nor ductal dilation.  IMPRESSION: Findings are consistent with acute cholecystitis with cholelithiasis.   Electronically Signed   By: David  Martinique   On: 09/23/2013 08:14    Scheduled Meds: . ampicillin-sulbactam (UNASYN) IV  3 g Intravenous Q6H  . insulin aspart  0-15 Units Subcutaneous TID WC   Continuous Infusions: . 0.9 % NaCl with KCl 20 mEq / L 75 mL/hr at 09/24/13 4782    Principal Problem:   Cholecystitis, acute Active Problems:   Diabetes mellitus without complication   Hypertension   Smoker   Symptomatic cholelithiasis   Leukocytosis   Hyponatremia   Hypokalemia   Acute cholecystitis    Time spent: 35 minutes    [facility] Pager [phone]. If 7PM-7AM, please contact night-coverage at [email or web address], password [facility] 09/24/2013, 9:12 AM  LOS: 1 day

## 2013-09-24 NOTE — Consult Note (Signed)
REVIEWED-MRCP SHOWS NO CBD STONES. SURGERY CONSULT.

## 2013-09-24 NOTE — Progress Notes (Signed)
Patient seen and examined. Note reviewed  Has continued abdominal pain, but overall is feeling mildly better. He underwent MRCP today which did not show any bile duct stones. Plans are for laparoscopic cholecystectomy tomorrow with possible intraoperative cholangiogram. Continue antibiotics for now. Appreciate GI and Gen. surgery assistance. Blood pressure and blood sugars appear to be in fair range.  MEMON,JEHANZEB

## 2013-09-24 NOTE — Progress Notes (Signed)
MRCP is negative for choledocholithiasis. We'll proceed with laparoscopic cholecystectomy with possible cholangiograms tomorrow. The risks and benefits of the procedure including bleeding, infection, cardiopulmonary difficulties, and the possibility of an open procedure were fully explained to the patient, who gave informed consent.

## 2013-09-24 NOTE — Care Management Utilization Note (Signed)
UR completed 

## 2013-09-24 NOTE — Consult Note (Signed)
Referring Provider: Dr. Arnoldo Morale Primary Care Physician:  Odette Fraction, MD Primary Gastroenterologist:  Dr. Oneida Alar  Date of Admission: 09/23/13 Date of Consultation: 09/24/13  Reason for Consultation:  Elevated LFTs, cholelithiasis  HPI:  Scott Ballard is a pleasant 68 year old male admitted with acute cholecystitis with cholelithiasis.  Saturday night with acute onset of pain. Notes prior history of similar episodes for the past year or two. Admitted Sunday due to worsening pain. RUQ pain, radiating around back. Associated nausea, vomiting yesterday. No fever or chills. LFTs on admission were normal; today rise in bilirubin to 4.6 with direct component.   No prior colonoscopy. No upper GI symptoms. No lower GI symptoms.   Past Medical History  Diagnosis Date  . Smoker   . Diabetes mellitus without complication   . Hypertension     Past Surgical History  Procedure Laterality Date  . Knee arthroscopy    . Melanoma excision    . Sinus      Prior to Admission medications   Medication Sig Start Date End Date Taking? Authorizing Provider  aspirin 81 MG tablet Take 81 mg by mouth daily.   Yes Historical Provider, MD  CINNAMON PO Take 1 capsule by mouth every other day.   Yes Historical Provider, MD  Cyanocobalamin (VITAMIN B 12 PO) Take 1 tablet by mouth daily.   Yes Historical Provider, MD  lisinopril (PRINIVIL,ZESTRIL) 20 MG tablet Take 20 mg by mouth daily.   Yes Historical Provider, MD  naproxen sodium (ANAPROX) 220 MG tablet Take 440 mg by mouth daily as needed (pain).   Yes Historical Provider, MD  oxyCODONE-acetaminophen (PERCOCET/ROXICET) 5-325 MG per tablet Take 1 tablet by mouth every 4 (four) hours as needed for severe pain.   Yes Historical Provider, MD  ranitidine (ZANTAC) 150 MG tablet Take 150 mg by mouth daily as needed for heartburn.   Yes Historical Provider, MD  naproxen (NAPROSYN) 500 MG tablet Take 1 tablet (500 mg total) by mouth 2 (two) times daily.  02/12/12   Delora Fuel, MD    Current Facility-Administered Medications  Medication Dose Route Frequency Provider Last Rate Last Dose  . 0.9 % NaCl with KCl 20 mEq/ L  infusion   Intravenous Continuous Radene Gunning, NP 75 mL/hr at 09/24/13 7829    . acetaminophen (TYLENOL) tablet 650 mg  650 mg Oral Q6H PRN Radene Gunning, NP       Or  . acetaminophen (TYLENOL) suppository 650 mg  650 mg Rectal Q6H PRN Radene Gunning, NP      . alum & mag hydroxide-simeth (MAALOX/MYLANTA) 200-200-20 MG/5ML suspension 30 mL  30 mL Oral Q6H PRN Radene Gunning, NP      . Ampicillin-Sulbactam (UNASYN) 3 g in sodium chloride 0.9 % 100 mL IVPB  3 g Intravenous Q6H Kathie Dike, MD   3 g at 09/24/13 0335  . bisacodyl (DULCOLAX) suppository 10 mg  10 mg Rectal Daily PRN Radene Gunning, NP      . HYDROcodone-acetaminophen (NORCO/VICODIN) 5-325 MG per tablet 1-2 tablet  1-2 tablet Oral Q4H PRN Radene Gunning, NP   2 tablet at 09/24/13 0019  . HYDROmorphone (DILAUDID) injection 1 mg  1 mg Intravenous Q3H PRN Radene Gunning, NP   1 mg at 09/24/13 0335  . insulin aspart (novoLOG) injection 0-15 Units  0-15 Units Subcutaneous TID WC Radene Gunning, NP   3 Units at 09/24/13 [phone]  . ondansetron (ZOFRAN) tablet 4 mg  4 mg Oral Q6H PRN Radene Gunning, NP       Or  . ondansetron [facility]) injection 4 mg  4 mg Intravenous Q6H PRN Radene Gunning, NP   4 mg at 09/24/13 0335  . polyethylene glycol (MIRALAX / GLYCOLAX) packet 17 g  17 g Oral Daily PRN Radene Gunning, NP      . traZODone (DESYREL) tablet 25 mg  25 mg Oral QHS PRN Radene Gunning, NP   25 mg at 09/24/13 0019    Allergies as of 09/23/2013  . (No Known Allergies)    Family History  Problem Relation Age of Onset  . Colon cancer Neg Hx     History   Social History  . Marital Status: Married    Spouse Name: N/A    Number of Children: N/A  . Years of Education: N/A   Occupational History  . Not on file.   Social History Main Topics  . Smoking status: Current Every  Day Smoker    Types: Cigars  . Smokeless tobacco: Not on file     Comment: chews on cigars  . Alcohol Use: No  . Drug Use: No  . Sexual Activity: Not on file   Other Topics Concern  . Not on file   Social History Narrative  . No narrative on file    Review of Systems: Gen: see HPI  CV: Denies chest pain, heart palpitations, syncope, edema  Resp: Denies shortness of breath with rest, cough, wheezing GI: see HPI GU : Denies urinary burning, urinary frequency, urinary incontinence.  MS: Denies joint pain,swelling, cramping Derm: Denies rash, itching, dry skin Psych: Denies depression, anxiety,confusion, or memory loss Heme: Denies bruising, bleeding, and enlarged lymph nodes.  Physical Exam: Vital signs in last 24 hours: Temp:  [97.6 F (36.4 C)-98.6 F (37 C)] 98.5 F (36.9 C) (09/01 0522) Pulse Rate:  [65-88] 80 (09/01 0522) Resp:  [18-26] 20 (09/01 0522) BP: (125-156)/(53-79) 125/66 mmHg (09/01 0522) SpO2:  [92 %-96 %] 95 % (09/01 0522) Weight:  [231 lb 7.7 oz (105 kg)] 231 lb 7.7 oz (105 kg) (08/31 1500) Last BM Date: 09/22/13 General:   Alert,  Well-developed, well-nourished, pleasant and cooperative in NAD Head:  Normocephalic and atraumatic. Eyes:  Sclera clear, no icterus.   Conjunctiva pink. Ears:  Normal auditory acuity. Nose:  No deformity, discharge,  or lesions. Mouth:  No deformity or lesions.  Lungs:  Clear throughout to auscultation.   No wheezes, crackles, or rhonchi. No acute distress. Heart:  S1 S2 present without any murmurs Abdomen:  Soft, TTP epigastric and RUQ and nondistended. No masses, hepatosplenomegaly or hernias noted. Normal bowel sounds, without guarding, and without rebound.   Rectal:  Deferred  Msk:  Symmetrical without gross deformities. Normal posture. Extremities:  Without edema. Neurologic:  Alert and  oriented x4;  grossly normal neurologically. Skin:  Intact without significant lesions or rashes. Psych:  Alert and cooperative.  Normal mood and affect.  Intake/Output from previous day:   Intake/Output this shift:    Lab Results:  Recent Labs  09/22/13 2245 09/23/13 0520 09/24/13 0638  WBC 9.5 10.6* 19.2*  HGB 13.4 14.0 15.3  HCT 37.8* 39.0 43.4  PLT 121* 136* 170   BMET  Recent Labs  09/22/13 2245 09/23/13 0520 09/24/13 0638  NA 136* 135* 134*  K 3.9 3.5* 4.6  CL 101 99 99  CO2 23 22 24   GLUCOSE 153* 250* 203*  BUN 11 8 19  CREATININE 0.86 0.69 0.85  CALCIUM 8.7 8.5 8.5   LFT  Recent Labs  09/23/13 0520 09/24/13 0638  PROT 7.0 6.5  ALBUMIN 3.6 2.9*  AST 16 43*  ALT 12 47  ALKPHOS 54 86  BILITOT 1.2 4.6*  BILIDIR  --  3.5*   Studies/Results: Dg Chest Portable 1 View  09/22/2013   CLINICAL DATA:  CHEST PAIN  EXAM: PORTABLE CHEST - 1 VIEW  COMPARISON:  03/03/2012  FINDINGS: Lungs are clear. Heart size and mediastinal contours are within normal limits. No effusion. Visualized skeletal structures are unremarkable.  IMPRESSION: No acute cardiopulmonary disease.   Electronically Signed   By: Arne Cleveland M.D.   On: 09/22/2013 22:53   US Abdomen Limited Ruq  09/23/2013   CLINICAL DATA:  Right upper quadrant pain  EXAM: US ABDOMEN LIMITED - RIGHT UPPER QUADRANT  COMPARISON:  None.  FINDINGS: Gallbladder:  The gallbladder is distended and contains a large amount of echogenic material (some of which shadows) consistent with sludge and small stones. The gallbladder wall is thickened at 4 mm. There is no definite pericholecystic fluid. There is a positive sonographic Murphy's sign.  Common bile duct:  Diameter: 4.7 mm  Liver:  The liver exhibits normal echotexture with no focal mass nor ductal dilation.  IMPRESSION: Findings are consistent with acute cholecystitis with cholelithiasis.   Electronically Signed   By: David  Martinique   On: 09/23/2013 08:14    Impression/Plan: 68 year old male admitted with cholelithiasis/cholecystitis and bump in bilirubin concerning for possible obstructive  process; clinically, patient is improved from admission. MRCP ordered for today for further assessment. Will continue to follow with you and proceed with ERCP if indicated. Further recommendations after MRCP completed.   As separate issue; consider first ever colonoscopy as outpatient for screening purposes.    Orvil Feil, ANP-BC [facility]     LOS: 1 day    09/24/2013, 8:51 AM

## 2013-09-25 ENCOUNTER — Encounter (HOSPITAL_COMMUNITY)
Admission: EM | Disposition: A | Discharge status: Home / Self Care | Payer: Self-pay | Source: Home / Self Care | Attending: Family Medicine

## 2013-09-25 ENCOUNTER — Inpatient Hospital Stay (HOSPITAL_COMMUNITY): Payer: 59 | Admitting: Anesthesiology

## 2013-09-25 ENCOUNTER — Encounter (HOSPITAL_COMMUNITY): Payer: 59 | Admitting: Anesthesiology

## 2013-09-25 HISTORY — PX: CHOLECYSTECTOMY: SHX55

## 2013-09-25 LAB — COMPREHENSIVE METABOLIC PANEL
ALT: 33 U/L (ref 0–53)
AST: 23 U/L (ref 0–37)
Albumin: 2.6 g/dL — ABNORMAL LOW (ref 3.5–5.2)
Alkaline Phosphatase: 106 U/L (ref 39–117)
Anion gap: 13 (ref 5–15)
BUN: 28 mg/dL — ABNORMAL HIGH (ref 6–23)
CO2: 24 mEq/L (ref 19–32)
CREATININE: 0.87 mg/dL (ref 0.50–1.35)
Calcium: 8.5 mg/dL (ref 8.4–10.5)
Chloride: 100 mEq/L (ref 96–112)
GFR calc Af Amer: >90 mL/min (ref >90)
GFR, EST NON AFRICAN AMERICAN: 85 mL/min — AB (ref >90)
GLUCOSE: 132 mg/dL — AB (ref 70–99)
Potassium: 4 mEq/L (ref 3.7–5.3)
SODIUM: 137 meq/L (ref 137–147)
Total Bilirubin: 2.4 mg/dL — ABNORMAL HIGH (ref 0.3–1.2)
Total Protein: 6.4 g/dL (ref 6.0–8.3)

## 2013-09-25 LAB — CBC
HCT: 41.5 % (ref 39.0–52.0)
HEMOGLOBIN: 14.7 g/dL (ref 13.0–17.0)
MCH: 33.3 pg (ref 26.0–34.0)
MCHC: 35.4 g/dL (ref 30.0–36.0)
MCV: 93.9 fL (ref 78.0–100.0)
Platelets: 201 10*3/uL (ref 150–400)
RBC: 4.42 MIL/uL (ref 4.22–5.81)
RDW: 13.3 % (ref 11.5–15.5)
WBC: 16.3 10*3/uL — ABNORMAL HIGH (ref 4.0–10.5)

## 2013-09-25 LAB — GLUCOSE, CAPILLARY
GLUCOSE-CAPILLARY: 135 mg/dL — AB (ref 70–99)
Glucose-Capillary: 134 mg/dL — ABNORMAL HIGH (ref 70–99)
Glucose-Capillary: 115 mg/dL — ABNORMAL HIGH (ref 70–99)
Glucose-Capillary: 110 mg/dL — ABNORMAL HIGH (ref 70–99)

## 2013-09-25 SURGERY — LAPAROSCOPIC CHOLECYSTECTOMY
Anesthesia: General

## 2013-09-25 MED ORDER — LACTATED RINGERS IV SOLN
INTRAVENOUS | Status: DC
  Administered 2013-09-25: 1000 mL via INTRAVENOUS
  Administered 2013-09-25: 12:00:00 via INTRAVENOUS

## 2013-09-25 MED ORDER — CHLORHEXIDINE GLUCONATE 4 % EX LIQD: 1.0000 "application " | Freq: Once | CUTANEOUS | Status: DC

## 2013-09-25 MED ORDER — SODIUM CHLORIDE 0.9 % IR SOLN
Status: DC | PRN
  Administered 2013-09-25: 1000 mL
  Administered 2013-09-25: 3000 mL

## 2013-09-25 MED ORDER — HYDROMORPHONE HCL PF 1 MG/ML IJ SOLN
1.0000 mg | INTRAMUSCULAR | Status: DC | PRN
  Administered 2013-09-25 – 2013-09-26 (×3): 1 mg via INTRAVENOUS
  Filled 2013-09-25 (×4): qty 1

## 2013-09-25 MED ORDER — BUPIVACAINE HCL (PF) 0.5 % IJ SOLN
INTRAMUSCULAR | Status: DC | PRN
  Administered 2013-09-25: 15 mL

## 2013-09-25 MED ORDER — FENTANYL CITRATE 0.05 MG/ML IJ SOLN
INTRAMUSCULAR | Status: DC | PRN
  Administered 2013-09-25 (×5): 50 ug via INTRAVENOUS

## 2013-09-25 MED ORDER — KETOROLAC TROMETHAMINE 30 MG/ML IJ SOLN
30.0000 mg | Freq: Once | INTRAMUSCULAR | Status: AC
  Administered 2013-09-25: 30 mg via INTRAVENOUS

## 2013-09-25 MED ORDER — MIDAZOLAM HCL 2 MG/2ML IJ SOLN
INTRAMUSCULAR | Status: AC
  Filled 2013-09-25: qty 2

## 2013-09-25 MED ORDER — PROPOFOL 10 MG/ML IV BOLUS
INTRAVENOUS | Status: AC
  Filled 2013-09-25: qty 20

## 2013-09-25 MED ORDER — LIDOCAINE HCL (CARDIAC) 10 MG/ML IV SOLN
INTRAVENOUS | Status: DC | PRN
  Administered 2013-09-25: 10 mg via INTRAVENOUS

## 2013-09-25 MED ORDER — ONDANSETRON HCL 4 MG/2ML IJ SOLN
4.0000 mg | Freq: Once | INTRAMUSCULAR | Status: AC
  Administered 2013-09-25: 4 mg via INTRAVENOUS

## 2013-09-25 MED ORDER — GLYCOPYRROLATE 0.2 MG/ML IJ SOLN
INTRAMUSCULAR | Status: AC
  Filled 2013-09-25: qty 1

## 2013-09-25 MED ORDER — FENTANYL CITRATE 0.05 MG/ML IJ SOLN
INTRAMUSCULAR | Status: AC
  Filled 2013-09-25: qty 5

## 2013-09-25 MED ORDER — LIDOCAINE HCL (PF) 1 % IJ SOLN
INTRAMUSCULAR | Status: AC
  Filled 2013-09-25: qty 5

## 2013-09-25 MED ORDER — POVIDONE-IODINE 10 % OINT PACKET
TOPICAL_OINTMENT | CUTANEOUS | Status: DC | PRN
Start: 2013-09-25 — End: 2013-09-25
  Administered 2013-09-25: 1 via TOPICAL

## 2013-09-25 MED ORDER — GLYCOPYRROLATE 0.2 MG/ML IJ SOLN
INTRAMUSCULAR | Status: AC
  Filled 2013-09-25: qty 4

## 2013-09-25 MED ORDER — ROCURONIUM BROMIDE 100 MG/10ML IV SOLN
INTRAVENOUS | Status: DC | PRN
  Administered 2013-09-25: 35 mg via INTRAVENOUS
  Administered 2013-09-25: 5 mg via INTRAVENOUS
  Administered 2013-09-25: 10 mg via INTRAVENOUS

## 2013-09-25 MED ORDER — ENOXAPARIN SODIUM 40 MG/0.4ML ~~LOC~~ SOLN
40.0000 mg | SUBCUTANEOUS | Status: DC
  Administered 2013-09-26 – 2013-09-27 (×2): 40 mg via SUBCUTANEOUS
  Filled 2013-09-25 (×2): qty 0.4

## 2013-09-25 MED ORDER — ONDANSETRON HCL 4 MG/2ML IJ SOLN: 4.0000 mg | Freq: Once | INTRAMUSCULAR | Status: DC | PRN

## 2013-09-25 MED ORDER — FENTANYL CITRATE 0.05 MG/ML IJ SOLN
INTRAMUSCULAR | Status: AC
  Filled 2013-09-25: qty 2

## 2013-09-25 MED ORDER — ONDANSETRON HCL 4 MG/2ML IJ SOLN
INTRAMUSCULAR | Status: AC
  Filled 2013-09-25: qty 2

## 2013-09-25 MED ORDER — ROCURONIUM BROMIDE 50 MG/5ML IV SOLN
INTRAVENOUS | Status: AC
  Filled 2013-09-25: qty 1

## 2013-09-25 MED ORDER — LACTATED RINGERS IV SOLN
INTRAVENOUS | Status: DC
  Administered 2013-09-25 – 2013-09-26 (×2): via INTRAVENOUS

## 2013-09-25 MED ORDER — BUPIVACAINE HCL (PF) 0.5 % IJ SOLN
INTRAMUSCULAR | Status: AC
Start: 2013-09-25 — End: 2013-09-25
  Filled 2013-09-25: qty 30

## 2013-09-25 MED ORDER — NEOSTIGMINE METHYLSULFATE 10 MG/10ML IV SOLN
INTRAVENOUS | Status: DC | PRN
  Administered 2013-09-25: 3 mg via INTRAVENOUS

## 2013-09-25 MED ORDER — PROPOFOL 10 MG/ML IV BOLUS
INTRAVENOUS | Status: DC | PRN
  Administered 2013-09-25: 50 mg via INTRAVENOUS
  Administered 2013-09-25: 150 mg via INTRAVENOUS

## 2013-09-25 MED ORDER — GLYCOPYRROLATE 0.2 MG/ML IJ SOLN
0.2000 mg | Freq: Once | INTRAMUSCULAR | Status: AC
  Administered 2013-09-25: 0.2 mg via INTRAVENOUS

## 2013-09-25 MED ORDER — MIDAZOLAM HCL 2 MG/2ML IJ SOLN
1.0000 mg | INTRAMUSCULAR | Status: DC | PRN
  Administered 2013-09-25: 2 mg via INTRAVENOUS

## 2013-09-25 MED ORDER — KETOROLAC TROMETHAMINE 30 MG/ML IJ SOLN
INTRAMUSCULAR | Status: AC
  Filled 2013-09-25: qty 1

## 2013-09-25 MED ORDER — FENTANYL CITRATE 0.05 MG/ML IJ SOLN
25.0000 ug | INTRAMUSCULAR | Status: DC | PRN
  Administered 2013-09-25: 50 ug via INTRAVENOUS

## 2013-09-25 MED ORDER — POVIDONE-IODINE 10 % EX OINT
TOPICAL_OINTMENT | CUTANEOUS | Status: AC
  Filled 2013-09-25: qty 1

## 2013-09-25 MED ORDER — GLYCOPYRROLATE 0.2 MG/ML IJ SOLN
INTRAMUSCULAR | Status: DC | PRN
  Administered 2013-09-25: 0.6 mg via INTRAVENOUS

## 2013-09-25 MED ORDER — HEMOSTATIC AGENTS (NO CHARGE) OPTIME
TOPICAL | Status: DC | PRN
  Administered 2013-09-25: 1 via TOPICAL

## 2013-09-25 SURGICAL SUPPLY — 41 items
APPLIER CLIP LAPSCP 10X32 DD (CLIP) ×5 IMPLANT
BAG HAMPER (MISCELLANEOUS) ×3 IMPLANT
BAG SPEC RTRVL LRG 6X4 10 (ENDOMECHANICALS) ×1
CLOTH BEACON ORANGE TIMEOUT ST (SAFETY) ×3 IMPLANT
COVER LIGHT HANDLE STERIS (MISCELLANEOUS) ×6 IMPLANT
DECANTER SPIKE VIAL GLASS SM (MISCELLANEOUS) ×3 IMPLANT
DURAPREP 26ML APPLICATOR (WOUND CARE) ×3 IMPLANT
ELECT REM PT RETURN 9FT ADLT (ELECTROSURGICAL) ×3
ELECTRODE REM PT RTRN 9FT ADLT (ELECTROSURGICAL) ×1 IMPLANT
EVACUATOR DRAINAGE 10X20 100CC (DRAIN) IMPLANT
EVACUATOR SILICONE 100CC (DRAIN) ×3
FILTER SMOKE EVAC LAPAROSHD (FILTER) ×3 IMPLANT
FORMALIN 10 PREFIL 120ML (MISCELLANEOUS) ×3 IMPLANT
GLOVE SURG SS PI 7.5 STRL IVOR (GLOVE) ×3 IMPLANT
GOWN STRL REUS W/TWL LRG LVL3 (GOWN DISPOSABLE) ×9 IMPLANT
HEMOSTAT SNOW SURGICEL 2X4 (HEMOSTASIS) ×5 IMPLANT
INST SET LAPROSCOPIC AP (KITS) ×3 IMPLANT
IV NS IRRIG 3000ML ARTHROMATIC (IV SOLUTION) ×2 IMPLANT
KIT ROOM TURNOVER APOR (KITS) ×3 IMPLANT
MANIFOLD NEPTUNE II (INSTRUMENTS) ×3 IMPLANT
NDL INSUFFLATION 14GA 120MM (NEEDLE) ×1 IMPLANT
NEEDLE INSUFFLATION 14GA 120MM (NEEDLE) ×3 IMPLANT
NS IRRIG 1000ML POUR BTL (IV SOLUTION) ×3 IMPLANT
PACK LAP CHOLE LZT030E (CUSTOM PROCEDURE TRAY) ×3 IMPLANT
PAD ARMBOARD 7.5X6 YLW CONV (MISCELLANEOUS) ×3 IMPLANT
POUCH SPECIMEN RETRIEVAL 10MM (ENDOMECHANICALS) ×3 IMPLANT
SET BASIN LINEN APH (SET/KITS/TRAYS/PACK) ×3 IMPLANT
SET TUBE IRRIG SUCTION NO TIP (IRRIGATION / IRRIGATOR) ×2 IMPLANT
SLEEVE ENDOPATH XCEL 5M (ENDOMECHANICALS) ×3 IMPLANT
SPONGE GAUZE 2X2 8PLY STER LF (GAUZE/BANDAGES/DRESSINGS) ×4
SPONGE GAUZE 2X2 8PLY STRL LF (GAUZE/BANDAGES/DRESSINGS) ×8 IMPLANT
STAPLER VISISTAT (STAPLE) ×3 IMPLANT
SUT ETHILON 3 0 FSL (SUTURE) ×2 IMPLANT
SUT VICRYL 0 UR6 27IN ABS (SUTURE) ×5 IMPLANT
TAPE CLOTH SURG 4X10 WHT LF (GAUZE/BANDAGES/DRESSINGS) ×2 IMPLANT
TROCAR ENDO BLADELESS 11MM (ENDOMECHANICALS) ×3 IMPLANT
TROCAR XCEL NON-BLD 5MMX100MML (ENDOMECHANICALS) ×5 IMPLANT
TROCAR XCEL UNIV SLVE 11M 100M (ENDOMECHANICALS) ×3 IMPLANT
TUBING INSUFFLATION (TUBING) ×3 IMPLANT
WARMER LAPAROSCOPE (MISCELLANEOUS) ×3 IMPLANT
YANKAUER SUCT 12FT TUBE ARGYLE (SUCTIONS) ×3 IMPLANT

## 2013-09-25 NOTE — Op Note (Signed)
Patient:  Scott Ballard  DOB:  April 22, 1945  MRN:  [id number]   Preop Diagnosis:  Cholecystitis, cholelithiasis  Postop Diagnosis:  Same, gangrene of gallbladder  Procedure:  Laparoscopic cholecystectomy  Surgeon: Aviva Signs, M.D.  Anes:  General endotracheal  Indications:  Patient is a 68 year old white male who presented with right upper quadrant abdominal pain and nausea. He was found to have acute cholecystitis with cholelithiasis. Preoperative MRCP was negative for choledocholithiasis. Patient now presents for lap scopic cholecystectomy. Risks and benefits of the procedure including bleeding, infection, hepatobiliary injury, possibility of an open procedure were fully explained to the patient, who gave informed consent.  Procedure note:  The patient is placed the supine position. After induction of general endotracheal, the abdomen was prepped and draped using usual sterile technique with DuraPrep. Surgical site confirmation was performed.  A supraumbilical incision was made down to the fascia. A Veress needle was introduced into the abdominal cavity and confirmation of placement was done using the saline drop test. The abdomen was then insufflated to 16 mm mercury. An 11 mm trocar was introduced into the abdominal cavity under direct visualization the difficulty. The patient was placed in reverse Trendelenburg position and additional 11 mm trocar was placed the epigastric region and 5 mm trochars were placed were upper quadrant and right flank regions. The liver was inspected and noted within normal limits. The gallbladder was noted to be acutely inflamed with areas of gangrenous soft tissue. Dark-colored fluid was noted within the right upper quadrant. This was evacuated without difficulty. The gallbladder was then retracted in a dynamic fashion in order to expose the trianble of Calot. The cystic duct was first identified. Its junction to the infundibulum was fully identified. Endoclips  placed proximally distally on the cystic duct, and the cystic duct was divided. This was likewise done cystic artery. The gallbladder was then freed away from the gallbladder fossa using Bovie electrocautery. The gallbladder was delivered through the epigastric trocar site using an Endo Catch bag. The gallbladder fossa was inspected and no abnormal bleeding or bile leakage was noted. Surgicel was placed in the gallbladder fossa. A #10 flat Jackson-Pratt drain was placed in right upper quadrant and brought through the 5 mm trocar site in right upper quadrant. Secured to skin level using an 3-0 Prolene suture. All fluid and air were then evacuated after irrigation of the subhepatic space. All trochars removed. All wounds were irrigated normal saline. All wounds were injected with 0.5% Sensorcaine. The epigastric fascia as well as supraumbilical fascia were reapproximated using 0 Vicryl interrupted sutures. All skin incisions were closed using staples. Betadine ointment and dry sterile dressings were applied.  All tape and needle counts were correct at the end of the procedure. Patient was extubated in the operating room and transferred to PACU in stable condition.  Complications:  None  EBL:  Minimal  Specimen:  Gallbladder  Drains: JP drain the subhepatic space

## 2013-09-25 NOTE — Anesthesia Procedure Notes (Addendum)
Procedure Name: Intubation Date/Time: 09/25/2013 10:57 AM Performed by: Vista Deck Pre-anesthesia Checklist: Patient identified, Patient being monitored, Timeout performed, Emergency Drugs available and Suction available Patient Re-evaluated:Patient Re-evaluated prior to inductionOxygen Delivery Method: Circle System Utilized Preoxygenation: Pre-oxygenation with 100% oxygen Intubation Type: IV induction Ventilation: Mask ventilation without difficulty Laryngoscope Size: Miller and 2 Grade View: Grade I Tube type: Oral Tube size: 7.0 mm Number of attempts: 1 Airway Equipment and Method: stylet and Oral airway Placement Confirmation: ETT inserted through vocal cords under direct vision,  positive ETCO2 and breath sounds checked- equal and bilateral Secured at: 22 cm Tube secured with: Tape Dental Injury: Teeth and Oropharynx as per pre-operative assessment

## 2013-09-25 NOTE — Anesthesia Postprocedure Evaluation (Signed)
  Anesthesia Post-op Note  Patient: Scott Ballard  Procedure(s) Performed: Procedure(s): LAPAROSCOPIC CHOLECYSTECTOMY (N/A)  Patient Location: PACU  Anesthesia Type:General  Level of Consciousness: sedated and patient cooperative  Airway and Oxygen Therapy: Patient Spontanous Breathing and non-rebreather face mask  Post-op Pain: none  Post-op Assessment: Post-op Vital signs reviewed, Patient's Cardiovascular Status Stable and Respiratory Function Stable  Post-op Vital Signs: Reviewed and stable  Last Vitals:  Filed Vitals:   09/25/13 1219  BP: 149/78  Pulse: 97  Temp: 37.1 C  Resp: 22    Complications: No apparent anesthesia complications

## 2013-09-25 NOTE — Progress Notes (Signed)
Patient seen, independently examined and chart reviewed. I agree with exam, assessment and plan discussed with Dyanne Carrel, NP.  68 year old man presented with worsening right upper quadrant abdominal pain. Further workup revealed acute cholecystitis. He was seen by general surgery and gastroenterology for elevated bilirubin but MRCP without evidence of CBD stones. He underwent laparoscopic cholecystectomy 9/2.  PMH DM HTN  Subjective: Seen postoperatively. He is feeling better. He asks for a hamburger and french fries.  Objective: Afebrile, vital signs stable.  Gen. Appears comfortable, calm.  Psych. Alert, speech fluent, appropriate.  Cardiovascular. Regular rate and rhythm. No murmur, or gallop.  Respiratory. Clear to auscultation bilaterally. No wheezes, rales or rhonchi. Normal respiratory effort   Chemistry: Capillary blood sugars stable. Basic metabolic panel unremarkable. Total bilirubin trending downwards. AST has normalized.  Heme: Leukocytosis modestly improved, 19.2 >> 16.3.  Acute cholecystitis, status post laparoscopic cholecystectomy today which revealed gangrenous gallbladder. Currently feeling well. Postoperative management per surgery. Anticipate discharge next 48 hours.  Murray Hodgkins, MD [facility] [phone]

## 2013-09-25 NOTE — Anesthesia Preprocedure Evaluation (Signed)
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), Type 2  Renal/GU      Musculoskeletal   Abdominal   Peds  Hematology   Anesthesia Other Findings   Reproductive/Obstetrics                           Anesthesia Physical Anesthesia Plan  ASA: III  Anesthesia Plan: General   Post-op Pain Management:    Induction: Intravenous  Airway Management Planned: Oral ETT  Additional Equipment:   Intra-op Plan:   Post-operative Plan: Extubation in OR  Informed Consent: I have reviewed the patients History and Physical, chart, labs and discussed the procedure including the risks, benefits and alternatives for the proposed anesthesia with the patient or authorized representative who has indicated his/her understanding and acceptance.     Plan Discussed with:   Anesthesia Plan Comments:         Anesthesia Quick Evaluation

## 2013-09-25 NOTE — Discharge Summary (Deleted)
[facility] PROGRESS NOTE  Scott Ballard [id number]:[id number] DOB: February 17, 1945 DOA: 09/23/2013 PCP: Odette Fraction, MD  Assessment/Plan: Cholecystitis, acute: management per surgery. Troponin negative x3. Leukocytosis tending downward.  Total bilirubin trending down as well. He remains afebrile and non-toxic appearing. Continue Unasyn day #3. MRCP 09/24/13 without cholelithiasis. Scheduled for lap chole today.   Active Problems:  Leukocytosis: trending downward. He remains afebrile and nontoxic appearing. Unasyn day #3.   Hyponatremia: Mild and resolved. Monitor   Hypokalemia: resolved. Monitor  Diabetes mellitus without complication: CBG range 093-267. Diet controlled at home. A1c 6.1. Continue sliding scale insulin for optimal control.   Hypertension: Controlled.   Smoker: Cessation counseling offered.   Symptomatic cholelithiasis: See 1   Code Status: full Family Communication: wife at bedside Disposition Plan: home hopefully tomorrow   Consultants:  Dr Arnoldo Morale general surgery  Procedures:  Lap chole 09/25/13  MRCp results below  Antibiotics:  Unasyn 09/23/13 >>> (indicate start date, and stop date if known)  HPI/Subjective: Awake alert. Reports slightly worsening pain compared to yesterday. Complaining "dryness want something to drink"   Objective: Filed Vitals:   09/25/13 0950  BP: 127/76  Pulse:   Temp:   Resp: 20    Intake/Output Summary (Last 24 hours) at 09/25/13 1105 Last data filed at 09/25/13 0800  Gross per 24 hour  Intake 1843.75 ml  Output      5 ml  Net 1838.75 ml   Filed Weights   09/23/13 0445 09/23/13 1500 09/25/13 0906  Weight: 99.791 kg (220 lb) 105 kg (231 lb 7.7 oz) 104.781 kg (231 lb)    Exam:   General:  Well nourished NAD  Cardiovascular: tachycardic but regular. No MGR No LE edema  Respiratory: normal effort BS clear bilaterally no wheeze no rhonchi  Abdomen: slightly rounded but soft +BS mild tenderness particularly  upper right quadrant no guarding  Musculoskeletal: no clubbing or cyanosis MAE   Data Reviewed: Basic Metabolic Panel:  Recent Labs Lab 09/22/13 2245 09/23/13 0520 09/24/13 0638 09/25/13 0547  NA 136* 135* 134* 137  K 3.9 3.5* 4.6 4.0  CL 101 99 99 100  CO2 23 22 24 24   GLUCOSE 153* 250* 203* 132*  BUN 11 8 19  28*  CREATININE 0.86 0.69 0.85 0.87  CALCIUM 8.7 8.5 8.5 8.5   Liver Function Tests:  Recent Labs Lab 09/23/13 0520 09/24/13 0638 09/25/13 0547  AST 16 43* 23  ALT 12 47 33  ALKPHOS 54 86 106  BILITOT 1.2 4.6* 2.4*  PROT 7.0 6.5 6.4  ALBUMIN 3.6 2.9* 2.6*    Recent Labs Lab 09/23/13 0520  LIPASE 17   No results found for this basename: AMMONIA,  in the last 168 hours CBC:  Recent Labs Lab 09/22/13 2245 09/23/13 0520 09/24/13 0638 09/25/13 0547  WBC 9.5 10.6* 19.2* 16.3*  NEUTROABS 7.4 8.8*  --   --   HGB 13.4 14.0 15.3 14.7  HCT 37.8* 39.0 43.4 41.5  MCV 93.3 92.4 93.9 93.9  PLT 121* 136* 170 201   Cardiac Enzymes:  Recent Labs Lab 09/22/13 2245 09/23/13 0520 09/23/13 1630 09/23/13 2146  TROPONINI <0.30 <0.30 <0.30 <0.30   BNP (last 3 results) No results found for this basename: PROBNP,  in the last 8760 hours CBG:  Recent Labs Lab 09/24/13 1202 09/24/13 1614 09/24/13 1952 09/25/13 0727 09/25/13 0910  GLUCAP 129* 155* 149* 135* 134*    Recent Results (from the past 240 hour(s))  SURGICAL PCR SCREEN  Status: None   Collection Time    2013/10/10  2:13 PM      Result Value Ref Range Status   MRSA, PCR NEGATIVE  NEGATIVE Final   Staphylococcus aureus NEGATIVE  NEGATIVE Final   Comment:            The Xpert SA Assay (FDA     approved for NASAL specimens     in patients over 56 years of age),     is one component of     a comprehensive surveillance     program.  Test performance has     been validated by [employer] for patients greater     than or equal to 1 year old.     It is not intended     to diagnose  infection nor to     guide or monitor treatment.     Studies: Mr 3d Recon At Scanner  Oct 10, 2013   CLINICAL DATA:  Jaundice  EXAM: MRI ABDOMEN WITHOUT AND WITH CONTRAST (INCLUDING MRCP)  TECHNIQUE: Multiplanar multisequence MR imaging of the abdomen was performed both before and after the administration of intravenous contrast. Heavily T2-weighted images of the biliary and pancreatic ducts were obtained, and three-dimensional MRCP images were rendered by post processing.  CONTRAST:  67mL MULTIHANCE GADOBENATE DIMEGLUMINE 529 MG/ML IV SOLN  COMPARISON:  CT abdomen pelvis dated 01/25/2009  FINDINGS: Motion degraded images.  Trace pleural effusions with patchy opacities in the bilateral lower lobes, likely atelectasis.  Liver is within normal limits. No suspicious/enhancing hepatic lesions. No hepatic steatosis.  Spleen pancreas, and adrenal glands are within normal limits.  Cholelithiasis with gallbladder distention, gallbladder wall thickening/inflammatory changes, and pericholecystic fluid, compatible with acute cholecystitis.  No intrahepatic or extrahepatic ductal dilatation. Common duct measures 4 mm. No choledocholithiasis is seen.  4.5 cm left lower pole renal cyst. Kidneys are otherwise within normal limits. No hydronephrosis.  Upper abdominal ascites.  No suspicious abdominal lymphadenopathy.  No focal osseous lesions.  IMPRESSION: Cholelithiasis with acute cholecystitis.  No evidence of choledocholithiasis.   Electronically Signed   By: Julian Hy M.D.   On: 2013/10/10 10:24   Mr Abd W/wo Cm/mrcp  10/10/13   CLINICAL DATA:  Jaundice  EXAM: MRI ABDOMEN WITHOUT AND WITH CONTRAST (INCLUDING MRCP)  TECHNIQUE: Multiplanar multisequence MR imaging of the abdomen was performed both before and after the administration of intravenous contrast. Heavily T2-weighted images of the biliary and pancreatic ducts were obtained, and three-dimensional MRCP images were rendered by post processing.  CONTRAST:   43mL MULTIHANCE GADOBENATE DIMEGLUMINE 529 MG/ML IV SOLN  COMPARISON:  CT abdomen pelvis dated 01/25/2009  FINDINGS: Motion degraded images.  Trace pleural effusions with patchy opacities in the bilateral lower lobes, likely atelectasis.  Liver is within normal limits. No suspicious/enhancing hepatic lesions. No hepatic steatosis.  Spleen pancreas, and adrenal glands are within normal limits.  Cholelithiasis with gallbladder distention, gallbladder wall thickening/inflammatory changes, and pericholecystic fluid, compatible with acute cholecystitis.  No intrahepatic or extrahepatic ductal dilatation. Common duct measures 4 mm. No choledocholithiasis is seen.  4.5 cm left lower pole renal cyst. Kidneys are otherwise within normal limits. No hydronephrosis.  Upper abdominal ascites.  No suspicious abdominal lymphadenopathy.  No focal osseous lesions.  IMPRESSION: Cholelithiasis with acute cholecystitis.  No evidence of choledocholithiasis.   Electronically Signed   By: Julian Hy M.D.   On: Oct 10, 2013 10:24    Scheduled Meds: . [MAR HOLD] ampicillin-sulbactam (UNASYN) IV  3  g Intravenous Q6H  . chlorhexidine  1 application Topical Once  . [MAR HOLD] insulin aspart  0-15 Units Subcutaneous TID WC   Continuous Infusions: . 0.9 % NaCl with KCl 20 mEq / L 75 mL/hr at 09/25/13 0023  . lactated ringers 1,000 mL (09/25/13 0940)    Principal Problem:   Cholecystitis, acute Active Problems:   Diabetes mellitus without complication   Hypertension   Smoker   Symptomatic cholelithiasis   Leukocytosis   Hyponatremia   Hypokalemia   Acute cholecystitis    Time spent: [place] [facility] Pager [phone]. If 7PM-7AM, please contact night-coverage at [email or web address], password [facility] 09/25/2013, 11:05 AM  LOS: 2 days

## 2013-09-25 NOTE — Transfer of Care (Signed)
Immediate Anesthesia Transfer of Care Note  Patient: Scott Ballard  Procedure(s) Performed: Procedure(s) (LRB): LAPAROSCOPIC CHOLECYSTECTOMY (N/A)  Patient Location: PACU  Anesthesia Type: General  Level of Consciousness: awake  Airway & Oxygen Therapy: Patient Spontanous Breathing and non-rebreather face mask  Post-op Assessment: Report given to PACU RN, Post -op Vital signs reviewed and stable and Patient moving all extremities  Post vital signs: Reviewed and stable  Complications: No apparent anesthesia complications

## 2013-09-25 NOTE — Progress Notes (Signed)
[facility] PROGRESS NOTE  Scott Ballard DOB: 07/30/1945 DOA: 09/23/2013 PCP: Odette Fraction, MD  Assessment/Plan: Cholecystitis, acute: management per surgery. Troponin negative x3. Leukocytosis tending downward. Total bilirubin trending down as well. He remains afebrile and non-toxic appearing. Continue Unasyn day #3. MRCP 09/24/13 without cholelithiasis. Scheduled for lap chole today.   Active Problems:  Leukocytosis: trending downward. He remains afebrile and nontoxic appearing. Unasyn day #3.   Hyponatremia: Mild and resolved. Monitor   Hypokalemia: resolved. Monitor   Diabetes mellitus without complication: CBG range 532-992. Diet controlled at home. A1c 6.1. Continue sliding scale insulin for optimal control.   Hypertension: Controlled.   Smoker: Cessation counseling offered.   Symptomatic cholelithiasis: See 1  Code Status: full Family Communication: wife at bedsde Disposition Plan: home hopefully in 24-48 hours   Consultants:  General surgery dr Arnoldo Morale  Procedures: Lap chole 09/25/13  MRCp results below   Antibiotics: Unasyn 09/23/13 >>>   HPI/Subjective: Awake alert. Reports slightly worsening pain compared to yesterday. Complaining "dryness want something to drink"    Objective: Filed Vitals:   09/25/13 0950  BP: 127/76  Pulse:   Temp:   Resp: 20    Intake/Output Summary (Last 24 hours) at 09/25/13 1146 Last data filed at 09/25/13 1129  Gross per 24 hour  Intake 2743.75 ml  Output      5 ml  Net 2738.75 ml   Filed Weights   09/23/13 0445 09/23/13 1500 09/25/13 0906  Weight: 99.791 kg (220 lb) 105 kg (231 lb 7.7 oz) 104.781 kg (231 lb)    Exam: General: Well nourished NAD  Cardiovascular: tachycardic but regular. No MGR No LE edema  Respiratory: normal effort BS clear bilaterally no wheeze no rhonchi  Abdomen: slightly rounded but soft +BS mild tenderness particularly upper right quadrant no guarding  Musculoskeletal:  no clubbing or cyanosis MAE     Data Reviewed: Basic Metabolic Panel:  Recent Labs Lab 09/22/13 2245 09/23/13 0520 09/24/13 0638 09/25/13 0547  NA 136* 135* 134* 137  K 3.9 3.5* 4.6 4.0  CL 101 99 99 100  CO2 23 22 24 24   GLUCOSE 153* 250* 203* 132*  BUN 11 8 19  28*  CREATININE 0.86 0.69 0.85 0.87  CALCIUM 8.7 8.5 8.5 8.5   Liver Function Tests:  Recent Labs Lab 09/23/13 0520 09/24/13 0638 09/25/13 0547  AST 16 43* 23  ALT 12 47 33  ALKPHOS 54 86 106  BILITOT 1.2 4.6* 2.4*  PROT 7.0 6.5 6.4  ALBUMIN 3.6 2.9* 2.6*    Recent Labs Lab 09/23/13 0520  LIPASE 17   No results found for this basename: AMMONIA,  in the last 168 hours CBC:  Recent Labs Lab 09/22/13 2245 09/23/13 0520 09/24/13 0638 09/25/13 0547  WBC 9.5 10.6* 19.2* 16.3*  NEUTROABS 7.4 8.8*  --   --   HGB 13.4 14.0 15.3 14.7  HCT 37.8* 39.0 43.4 41.5  MCV 93.3 92.4 93.9 93.9  PLT 121* 136* 170 201   Cardiac Enzymes:  Recent Labs Lab 09/22/13 2245 09/23/13 0520 09/23/13 1630 09/23/13 2146  TROPONINI <0.30 <0.30 <0.30 <0.30   BNP (last 3 results) No results found for this basename: PROBNP,  in the last 8760 hours CBG:  Recent Labs Lab 09/24/13 1202 09/24/13 1614 09/24/13 1952 09/25/13 0727 09/25/13 0910  GLUCAP 129* 155* 149* 135* 134*    Recent Results (from the past 240 hour(s))  SURGICAL PCR SCREEN     Status: None   Collection Time  2013-10-08  2:13 PM      Result Value Ref Range Status   MRSA, PCR NEGATIVE  NEGATIVE Final   Staphylococcus aureus NEGATIVE  NEGATIVE Final   Comment:            The Xpert SA Assay (FDA     approved for NASAL specimens     in patients over 32 years of age),     is one component of     a comprehensive surveillance     program.  Test performance has     been validated by [employer] for patients greater     than or equal to 31 year old.     It is not intended     to diagnose infection nor to     guide or monitor treatment.      Studies: Mr 3d Recon At Scanner  10-08-13   CLINICAL DATA:  Jaundice  EXAM: MRI ABDOMEN WITHOUT AND WITH CONTRAST (INCLUDING MRCP)  TECHNIQUE: Multiplanar multisequence MR imaging of the abdomen was performed both before and after the administration of intravenous contrast. Heavily T2-weighted images of the biliary and pancreatic ducts were obtained, and three-dimensional MRCP images were rendered by post processing.  CONTRAST:  42mL MULTIHANCE GADOBENATE DIMEGLUMINE 529 MG/ML IV SOLN  COMPARISON:  CT abdomen pelvis dated 01/25/2009  FINDINGS: Motion degraded images.  Trace pleural effusions with patchy opacities in the bilateral lower lobes, likely atelectasis.  Liver is within normal limits. No suspicious/enhancing hepatic lesions. No hepatic steatosis.  Spleen pancreas, and adrenal glands are within normal limits.  Cholelithiasis with gallbladder distention, gallbladder wall thickening/inflammatory changes, and pericholecystic fluid, compatible with acute cholecystitis.  No intrahepatic or extrahepatic ductal dilatation. Common duct measures 4 mm. No choledocholithiasis is seen.  4.5 cm left lower pole renal cyst. Kidneys are otherwise within normal limits. No hydronephrosis.  Upper abdominal ascites.  No suspicious abdominal lymphadenopathy.  No focal osseous lesions.  IMPRESSION: Cholelithiasis with acute cholecystitis.  No evidence of choledocholithiasis.   Electronically Signed   By: Julian Hy M.D.   On: 10/08/2013 10:24   Mr Abd W/wo Cm/mrcp  2013-10-08   CLINICAL DATA:  Jaundice  EXAM: MRI ABDOMEN WITHOUT AND WITH CONTRAST (INCLUDING MRCP)  TECHNIQUE: Multiplanar multisequence MR imaging of the abdomen was performed both before and after the administration of intravenous contrast. Heavily T2-weighted images of the biliary and pancreatic ducts were obtained, and three-dimensional MRCP images were rendered by post processing.  CONTRAST:  38mL MULTIHANCE GADOBENATE DIMEGLUMINE 529 MG/ML IV  SOLN  COMPARISON:  CT abdomen pelvis dated 01/25/2009  FINDINGS: Motion degraded images.  Trace pleural effusions with patchy opacities in the bilateral lower lobes, likely atelectasis.  Liver is within normal limits. No suspicious/enhancing hepatic lesions. No hepatic steatosis.  Spleen pancreas, and adrenal glands are within normal limits.  Cholelithiasis with gallbladder distention, gallbladder wall thickening/inflammatory changes, and pericholecystic fluid, compatible with acute cholecystitis.  No intrahepatic or extrahepatic ductal dilatation. Common duct measures 4 mm. No choledocholithiasis is seen.  4.5 cm left lower pole renal cyst. Kidneys are otherwise within normal limits. No hydronephrosis.  Upper abdominal ascites.  No suspicious abdominal lymphadenopathy.  No focal osseous lesions.  IMPRESSION: Cholelithiasis with acute cholecystitis.  No evidence of choledocholithiasis.   Electronically Signed   By: Julian Hy M.D.   On: October 08, 2013 10:24    Scheduled Meds: . [MAR HOLD] ampicillin-sulbactam (UNASYN) IV  3 g Intravenous Q6H  . chlorhexidine  1 application  Topical Once  . [MAR HOLD] insulin aspart  0-15 Units Subcutaneous TID WC   Continuous Infusions: . 0.9 % NaCl with KCl 20 mEq / L 75 mL/hr at 09/25/13 0023  . lactated ringers 1,000 mL (09/25/13 0940)    Principal Problem:   Cholecystitis, acute Active Problems:   Diabetes mellitus without complication   Hypertension   Smoker   Symptomatic cholelithiasis   Leukocytosis   Hyponatremia   Hypokalemia   Acute cholecystitis    Time spent: 35 minutes    [place] [facility] Pager [phone]. If 7PM-7AM, please contact night-coverage at [email or web address], password [facility] 09/25/2013, 11:46 AM  LOS: 2 days

## 2013-09-26 ENCOUNTER — Encounter (HOSPITAL_COMMUNITY): Payer: Self-pay | Admitting: General Surgery

## 2013-09-26 LAB — BASIC METABOLIC PANEL
Anion gap: 10 (ref 5–15)
BUN: 23 mg/dL (ref 6–23)
CALCIUM: 8.2 mg/dL — AB (ref 8.4–10.5)
CHLORIDE: 100 meq/L (ref 96–112)
CO2: 27 meq/L (ref 19–32)
Creatinine, Ser: 0.81 mg/dL (ref 0.50–1.35)
GFR calc Af Amer: >90 mL/min (ref >90)
GFR calc non Af Amer: 88 mL/min — ABNORMAL LOW (ref >90)
Glucose, Bld: 105 mg/dL — ABNORMAL HIGH (ref 70–99)
Potassium: 4 mEq/L (ref 3.7–5.3)
Sodium: 137 mEq/L (ref 137–147)

## 2013-09-26 LAB — HEPATIC FUNCTION PANEL
ALT: 30 U/L (ref 0–53)
AST: 30 U/L (ref 0–37)
Albumin: 2.3 g/dL — ABNORMAL LOW (ref 3.5–5.2)
Alkaline Phosphatase: 98 U/L (ref 39–117)
BILIRUBIN DIRECT: 0.4 mg/dL — AB (ref 0.0–0.3)
BILIRUBIN TOTAL: 1.1 mg/dL (ref 0.3–1.2)
Indirect Bilirubin: 0.7 mg/dL (ref 0.3–0.9)
Total Protein: 6 g/dL (ref 6.0–8.3)

## 2013-09-26 LAB — GLUCOSE, CAPILLARY
GLUCOSE-CAPILLARY: 119 mg/dL — AB (ref 70–99)
Glucose-Capillary: 108 mg/dL — ABNORMAL HIGH (ref 70–99)
Glucose-Capillary: 138 mg/dL — ABNORMAL HIGH (ref 70–99)
Glucose-Capillary: 105 mg/dL — ABNORMAL HIGH (ref 70–99)
Glucose-Capillary: 109 mg/dL — ABNORMAL HIGH (ref 70–99)

## 2013-09-26 LAB — CBC
HCT: 36.9 % — ABNORMAL LOW (ref 39.0–52.0)
Hemoglobin: 13 g/dL (ref 13.0–17.0)
MCH: 33.4 pg (ref 26.0–34.0)
MCHC: 35.2 g/dL (ref 30.0–36.0)
MCV: 94.9 fL (ref 78.0–100.0)
Platelets: 187 10*3/uL (ref 150–400)
RBC: 3.89 MIL/uL — AB (ref 4.22–5.81)
RDW: 13.3 % (ref 11.5–15.5)
WBC: 9.6 10*3/uL (ref 4.0–10.5)

## 2013-09-26 LAB — MAGNESIUM: Magnesium: 2.3 mg/dL (ref 1.5–2.5)

## 2013-09-26 LAB — PHOSPHORUS: Phosphorus: 1.8 mg/dL — ABNORMAL LOW (ref 2.3–4.6)

## 2013-09-26 MED ORDER — LISINOPRIL 10 MG PO TABS
20.0000 mg | ORAL_TABLET | Freq: Every day | ORAL | Status: DC
  Administered 2013-09-26 – 2013-09-27 (×2): 20 mg via ORAL
  Filled 2013-09-26 (×2): qty 2

## 2013-09-26 MED ORDER — POTASSIUM PHOSPHATE MONOBASIC 500 MG PO TABS
500.0000 mg | ORAL_TABLET | Freq: Three times a day (TID) | ORAL | Status: DC
  Filled 2013-09-26 (×3): qty 1

## 2013-09-26 MED ORDER — K PHOS MONO-SOD PHOS DI & MONO 155-852-130 MG PO TABS
500.0000 mg | ORAL_TABLET | Freq: Three times a day (TID) | ORAL | Status: AC
  Administered 2013-09-26 – 2013-09-27 (×2): 500 mg via ORAL
  Filled 2013-09-26 (×3): qty 2

## 2013-09-26 NOTE — Progress Notes (Signed)
Late entry:  Patient ambulated in hallway this morning.  Ambulated to nurses' station with NT.  Tolerated well.  Patient refused to walk this afternoon.  Will attempt again.

## 2013-09-26 NOTE — Progress Notes (Signed)
UR review complete.  

## 2013-09-26 NOTE — Addendum Note (Signed)
Addendum created 09/26/13 1040 by Mickel Baas, CRNA   Modules edited: Notes Section   Notes Section:  File: [id number]

## 2013-09-26 NOTE — Progress Notes (Signed)
Patient seen, independently examined and chart reviewed. I agree with exam, assessment and plan discussed with Dyanne Carrel, NP.  Subjective: Overall feels better today than yesterday.  Objective: Afebrile, vital signs are stable. No hypoxia.  Gen. Appears calm, comfortable. Sitting in chair.  Psych. Alert. Speech fluent and appropriate.  Cardiovascular. Regular rate and rhythm. No murmur, rub or gallop.  Respiratory. Clear to auscultation bilaterally. No wheezes, rales or rhonchi. Normal respiratory effort.   Capillary blood sugars stable. Basic metabolic panel unremarkable. Phosphorus low 1.8. LFTs unremarkable.  CBC unremarkable.  Overall improving, remains afebrile. Continue management per general surgery, discussed with Dr. Arnoldo Morale, possible discharge 9/4.  Replace phosphorus.  Murray Hodgkins, MD [facility] [phone]

## 2013-09-26 NOTE — Anesthesia Postprocedure Evaluation (Signed)
  Anesthesia Post-op Note  Patient: Scott Ballard  Procedure(s) Performed: Procedure(s): LAPAROSCOPIC CHOLECYSTECTOMY (N/A)  Patient Location: Room 335  Anesthesia Type:General  Level of Consciousness: awake, alert , oriented and patient cooperative  Airway and Oxygen Therapy: Patient Spontanous Breathing  Post-op Pain: moderate  Post-op Assessment: Post-op Vital signs reviewed, Patient's Cardiovascular Status Stable, Respiratory Function Stable, Patent Airway, No signs of Nausea or vomiting and Pain level controlled  Post-op Vital Signs: Reviewed and stable  Last Vitals:  Filed Vitals:   09/26/13 1011  BP: 138/57  Pulse: 68  Temp:   Resp:     Complications: No apparent anesthesia complications

## 2013-09-26 NOTE — Progress Notes (Signed)
1 Day Post-Op  Subjective: Patient states his pain is less this morning. He still feels fatigued.  Objective: Vital signs in last 24 hours: Temp:  [97.7 F (36.5 C)-99 F (37.2 C)] 98.2 F (36.8 C) (09/03 1194) Pulse Rate:  [71-97] 76 (09/03 0608) Resp:  [18-23] 18 (09/03 0608) BP: (100-173)/(66-89) 160/70 mmHg (09/03 0608) SpO2:  [91 %-100 %] 95 % (09/03 0608) Last BM Date: 09/25/13  Intake/Output from previous day: 09/02 0701 - 09/03 0700 In: 3876.7 [P.O.:960; I.V.:2816.7; IV Piggyback:100] Out: 932 [Urine:800; Drains:22; Blood:10] Intake/Output this shift:    General appearance: cooperative and fatigued GI: Soft, dressings dry and intact. JP drainage minimal and serosanguineous in nature.  Lab Results:   Recent Labs  09/25/13 0547 09/26/13 0542  WBC 16.3* 9.6  HGB 14.7 13.0  HCT 41.5 36.9*  PLT 201 187   BMET  Recent Labs  09/25/13 0547 09/26/13 0542  NA 137 137  K 4.0 4.0  CL 100 100  CO2 24 27  GLUCOSE 132* 105*  BUN 28* 23  CREATININE 0.87 0.81  CALCIUM 8.5 8.2*   PT/INR No results found for this basename: LABPROT, INR,  in the last 72 hours  Studies/Results: Mr 3d Recon At Scanner  09/24/2013   CLINICAL DATA:  Jaundice  EXAM: MRI ABDOMEN WITHOUT AND WITH CONTRAST (INCLUDING MRCP)  TECHNIQUE: Multiplanar multisequence MR imaging of the abdomen was performed both before and after the administration of intravenous contrast. Heavily T2-weighted images of the biliary and pancreatic ducts were obtained, and three-dimensional MRCP images were rendered by post processing.  CONTRAST:  73mL MULTIHANCE GADOBENATE DIMEGLUMINE 529 MG/ML IV SOLN  COMPARISON:  CT abdomen pelvis dated 01/25/2009  FINDINGS: Motion degraded images.  Trace pleural effusions with patchy opacities in the bilateral lower lobes, likely atelectasis.  Liver is within normal limits. No suspicious/enhancing hepatic lesions. No hepatic steatosis.  Spleen pancreas, and adrenal glands are within  normal limits.  Cholelithiasis with gallbladder distention, gallbladder wall thickening/inflammatory changes, and pericholecystic fluid, compatible with acute cholecystitis.  No intrahepatic or extrahepatic ductal dilatation. Common duct measures 4 mm. No choledocholithiasis is seen.  4.5 cm left lower pole renal cyst. Kidneys are otherwise within normal limits. No hydronephrosis.  Upper abdominal ascites.  No suspicious abdominal lymphadenopathy.  No focal osseous lesions.  IMPRESSION: Cholelithiasis with acute cholecystitis.  No evidence of choledocholithiasis.   Electronically Signed   By: Julian Hy M.D.   On: 09/24/2013 10:24   Mr Abd W/wo Cm/mrcp  09/24/2013   CLINICAL DATA:  Jaundice  EXAM: MRI ABDOMEN WITHOUT AND WITH CONTRAST (INCLUDING MRCP)  TECHNIQUE: Multiplanar multisequence MR imaging of the abdomen was performed both before and after the administration of intravenous contrast. Heavily T2-weighted images of the biliary and pancreatic ducts were obtained, and three-dimensional MRCP images were rendered by post processing.  CONTRAST:  53mL MULTIHANCE GADOBENATE DIMEGLUMINE 529 MG/ML IV SOLN  COMPARISON:  CT abdomen pelvis dated 01/25/2009  FINDINGS: Motion degraded images.  Trace pleural effusions with patchy opacities in the bilateral lower lobes, likely atelectasis.  Liver is within normal limits. No suspicious/enhancing hepatic lesions. No hepatic steatosis.  Spleen pancreas, and adrenal glands are within normal limits.  Cholelithiasis with gallbladder distention, gallbladder wall thickening/inflammatory changes, and pericholecystic fluid, compatible with acute cholecystitis.  No intrahepatic or extrahepatic ductal dilatation. Common duct measures 4 mm. No choledocholithiasis is seen.  4.5 cm left lower pole renal cyst. Kidneys are otherwise within normal limits. No hydronephrosis.  Upper abdominal ascites.  No  suspicious abdominal lymphadenopathy.  No focal osseous lesions.  IMPRESSION:  Cholelithiasis with acute cholecystitis.  No evidence of choledocholithiasis.   Electronically Signed   By: Julian Hy M.D.   On: 09/24/2013 10:24    Anti-infectives: Anti-infectives   Start     Dose/Rate Route Frequency Ordered Stop   09/23/13 1600  Ampicillin-Sulbactam (UNASYN) 3 g in sodium chloride 0.9 % 100 mL IVPB     3 g 100 mL/hr over 60 Minutes Intravenous Every 6 hours 09/23/13 1406        Assessment/Plan: s/p Procedure(s): LAPAROSCOPIC CHOLECYSTECTOMY Impression: Stable status post laparoscopic cholecystectomy. Will advance diet as tolerated. Will encourage ambulation. Continue IV antibiotics to  LOS: 3 days    [place] A 09/26/2013

## 2013-09-26 NOTE — Progress Notes (Signed)
[facility] PROGRESS NOTE  Scott Ballard HAL:[id number] DOB: 08-02-1945 DOA: 09/23/2013 PCP: Odette Fraction, MD  Assessment/Plan: Cholecystitis, acute: management per surgery. S/p lap chole 09/25/13. Leukocytosis resolved. Max temp 99 last 24 hours. Total bilirubin within the limits of normal. Remains non-toxic appearing but is weak today. Continue Unasyn day #4.  Advance diet per surgery. Will mobilize. Active Problems:  Leukocytosis: resolved. Unasyn day #4   Hyponatremia: Mild and resolved. Monitor   Hypokalemia: resolved. Monitor   Diabetes mellitus without complication: CBG range 735-329. Diet controlled at home. A1c 6.1. Continue sliding scale insulin for optimal control.   Hypertension: only fair control. Pain may be contributing as he is taking minimal pain medicine. Will resume home lisinopril. Monitor   Smoker: Cessation counseling offered.   Code Status: full Family Communication: none present Disposition Plan: home hopefully tomorrow   Consultants:  General surgery Dr Arnoldo Morale  Procedures: Lap chole 09/25/13  MRCp results below  Antibiotics:  unasyn 09/23/13>>>  HPI/Subjective: Lying in bed. Reports feeling "rough" this am. Denies pain/nausea.   Objective: Filed Vitals:   09/26/13 1011  BP: 138/57  Pulse: 68  Temp:   Resp:     Intake/Output Summary (Last 24 hours) at 09/26/13 1125 Last data filed at 09/26/13 0933  Gross per 24 hour  Intake 3876.67 ml  Output    952 ml  Net 2924.67 ml   Filed Weights   09/23/13 0445 09/23/13 1500 09/25/13 0906  Weight: 99.791 kg (220 lb) 105 kg (231 lb 7.7 oz) 104.781 kg (231 lb)    Exam:   General:  Well nourished appears comfortable  Cardiovascular: RRR i hear no MGR No LE edema  Respiratory: normal effort slightly shallow. BS clear bilaterally no wheeze  Abdomen: slightly rounded but soft. Dressing dry and intact. JP drain with moderate amount bldy drainage. +BS mild diffuse tenderness.    Musculoskeletal: no clubbing or cyanosis   Data Reviewed: Basic Metabolic Panel:  Recent Labs Lab 09/22/13 2245 09/23/13 0520 09/24/13 0638 09/25/13 0547 09/26/13 0542  NA 136* 135* 134* 137 137  K 3.9 3.5* 4.6 4.0 4.0  CL 101 99 99 100 100  CO2 23 22 24 24 27   GLUCOSE 153* 250* 203* 132* 105*  BUN 11 8 19  28* 23  CREATININE 0.86 0.69 0.85 0.87 0.81  CALCIUM 8.7 8.5 8.5 8.5 8.2*  MG  --   --   --   --  2.3  PHOS  --   --   --   --  1.8*   Liver Function Tests:  Recent Labs Lab 09/23/13 0520 09/24/13 0638 09/25/13 0547 09/26/13 0542  AST 16 43* 23 30  ALT 12 47 33 30  ALKPHOS 54 86 106 98  BILITOT 1.2 4.6* 2.4* 1.1  PROT 7.0 6.5 6.4 6.0  ALBUMIN 3.6 2.9* 2.6* 2.3*    Recent Labs Lab 09/23/13 0520  LIPASE 17   No results found for this basename: AMMONIA,  in the last 168 hours CBC:  Recent Labs Lab 09/22/13 2245 09/23/13 0520 09/24/13 0638 09/25/13 0547 09/26/13 0542  WBC 9.5 10.6* 19.2* 16.3* 9.6  NEUTROABS 7.4 8.8*  --   --   --   HGB 13.4 14.0 15.3 14.7 13.0  HCT 37.8* 39.0 43.4 41.5 36.9*  MCV 93.3 92.4 93.9 93.9 94.9  PLT 121* 136* 170 201 187   Cardiac Enzymes:  Recent Labs Lab 09/22/13 2245 09/23/13 0520 09/23/13 1630 09/23/13 2146  TROPONINI <0.30 <0.30 <0.30 <0.30   BNP (  last 3 results) No results found for this basename: PROBNP,  in the last 8760 hours CBG:  Recent Labs Lab 09/25/13 1223 09/25/13 1652 09/25/13 2201 09/26/13 0721 09/26/13 1114  GLUCAP 115* 110* 108* 119* 138*    Recent Results (from the past 240 hour(s))  SURGICAL PCR SCREEN     Status: None   Collection Time    09/24/13  2:13 PM      Result Value Ref Range Status   MRSA, PCR NEGATIVE  NEGATIVE Final   Staphylococcus aureus NEGATIVE  NEGATIVE Final   Comment:            The Xpert SA Assay (FDA     approved for NASAL specimens     in patients over 70 years of age),     is one component of     a comprehensive surveillance     program.  Test  performance has     been validated by [employer] for patients greater     than or equal to 70 year old.     It is not intended     to diagnose infection nor to     guide or monitor treatment.     Studies: No results found.  Scheduled Meds: . ampicillin-sulbactam (UNASYN) IV  3 g Intravenous Q6H  . enoxaparin (LOVENOX) injection  40 mg Subcutaneous Q24H  . insulin aspart  0-15 Units Subcutaneous TID WC  . lisinopril  20 mg Oral Daily   Continuous Infusions: . lactated ringers 100 mL/hr at 09/26/13 0093    Principal Problem:   Acute cholecystitis Active Problems:   Diabetes mellitus without complication   Hypertension   Smoker   Symptomatic cholelithiasis   Leukocytosis    Time spent: 35 minutes    [facility] Pager [phone] If 7PM-7AM, please contact night-coverage at [email or web address], password [facility] 09/26/2013, 11:25 AM  LOS: 3 days

## 2013-09-27 DIAGNOSIS — F17200 Nicotine dependence, unspecified, uncomplicated: Secondary | ICD-10-CM

## 2013-09-27 LAB — CBC
HEMATOCRIT: 33.9 % — AB (ref 39.0–52.0)
Hemoglobin: 11.9 g/dL — ABNORMAL LOW (ref 13.0–17.0)
MCH: 32.8 pg (ref 26.0–34.0)
MCHC: 35.1 g/dL (ref 30.0–36.0)
MCV: 93.4 fL (ref 78.0–100.0)
Platelets: 169 10*3/uL (ref 150–400)
RBC: 3.63 MIL/uL — ABNORMAL LOW (ref 4.22–5.81)
RDW: 13 % (ref 11.5–15.5)
WBC: 6.7 10*3/uL (ref 4.0–10.5)

## 2013-09-27 LAB — HEPATIC FUNCTION PANEL
ALT: 41 U/L (ref 0–53)
AST: 51 U/L — ABNORMAL HIGH (ref 0–37)
Albumin: 2.1 g/dL — ABNORMAL LOW (ref 3.5–5.2)
Alkaline Phosphatase: 75 U/L (ref 39–117)
Bilirubin, Direct: 0.3 mg/dL (ref 0.0–0.3)
Indirect Bilirubin: 0.4 mg/dL (ref 0.3–0.9)
Total Bilirubin: 0.7 mg/dL (ref 0.3–1.2)
Total Protein: 5.6 g/dL — ABNORMAL LOW (ref 6.0–8.3)

## 2013-09-27 LAB — BASIC METABOLIC PANEL
ANION GAP: 9 (ref 5–15)
BUN: 16 mg/dL (ref 6–23)
CHLORIDE: 103 meq/L (ref 96–112)
CO2: 28 meq/L (ref 19–32)
CREATININE: 0.75 mg/dL (ref 0.50–1.35)
Calcium: 7.9 mg/dL — ABNORMAL LOW (ref 8.4–10.5)
GFR calc Af Amer: >90 mL/min (ref >90)
GFR calc non Af Amer: >90 mL/min (ref >90)
GLUCOSE: 102 mg/dL — AB (ref 70–99)
Potassium: 3.4 mEq/L — ABNORMAL LOW (ref 3.7–5.3)
Sodium: 140 mEq/L (ref 137–147)

## 2013-09-27 LAB — GLUCOSE, CAPILLARY: Glucose-Capillary: 90 mg/dL (ref 70–99)

## 2013-09-27 MED ORDER — AMOXICILLIN-POT CLAVULANATE 875-125 MG PO TABS: 1.0000 | ORAL_TABLET | Freq: Two times a day (BID) | ORAL | Status: DC

## 2013-09-27 MED ORDER — HYDROCODONE-ACETAMINOPHEN 5-325 MG PO TABS: 1.0000 | ORAL_TABLET | ORAL | Status: DC | PRN

## 2013-09-27 NOTE — Progress Notes (Signed)
2 Days Post-Op  Subjective: Feeling much better. Tolerating diet well.  Objective: Vital signs in last 24 hours: Temp:  [98 F (36.7 C)-99.7 F (37.6 C)] 98.4 F (36.9 C) (09/04 0419) Pulse Rate:  [61-69] 69 (09/04 0419) Resp:  [20] 20 (09/04 0419) BP: (125-156)/(57-75) 156/75 mmHg (09/04 0419) SpO2:  [94 %-96 %] 94 % (09/04 0419) Last BM Date: 09/25/13  Intake/Output from previous day: 09/03 0701 - 09/04 0700 In: 965 [P.O.:360; IV Piggyback:600] Out: 486 [Urine:450; Drains:35; Stool:1] Intake/Output this shift:    General appearance: alert, cooperative and no distress GI: Soft, incisions healing well. JP drain removed.  Lab Results:   Recent Labs  09/26/13 0542 09/27/13 0537  WBC 9.6 6.7  HGB 13.0 11.9*  HCT 36.9* 33.9*  PLT 187 169   BMET  Recent Labs  09/26/13 0542 09/27/13 0537  NA 137 140  K 4.0 3.4*  CL 100 103  CO2 27 28  GLUCOSE 105* 102*  BUN 23 16  CREATININE 0.81 0.75  CALCIUM 8.2* 7.9*   PT/INR No results found for this basename: LABPROT, INR,  in the last 72 hours  Studies/Results: No results found.  Anti-infectives: Anti-infectives   Start     Dose/Rate Route Frequency Ordered Stop   09/23/13 1600  Ampicillin-Sulbactam (UNASYN) 3 g in sodium chloride 0.9 % 100 mL IVPB     3 g 100 mL/hr over 60 Minutes Intravenous Every 6 hours 09/23/13 1406        Assessment/Plan: s/p Procedure(s): LAPAROSCOPIC CHOLECYSTECTOMY Impression: Stable, status post lap scopic cholecystectomy. Plan: Patient okay for discharge from surgical standpoint. I will see the patient next week in my office. He should be continued on ciprofloxacin x5 days.  LOS: 4 days    Yerachmiel Spinney A 09/27/2013

## 2013-09-27 NOTE — Discharge Instructions (Signed)

## 2013-09-27 NOTE — Discharge Summary (Signed)
Physician Discharge Summary  Scott Ballard KDX:[id number] DOB: [birth year] DOA: 09/23/2013  PCP: Scott Fraction, MD  Admit date: 09/23/2013 Discharge date: 09/27/2013  Recommendations for Outpatient Follow-up:  1. Resolution of acute cholecystitis 2. Recommend smoking cessation   Follow-up Information   Follow up with Scott So, MD. Schedule an appointment as soon as possible for a visit on 10/03/2013.   Specialty:  General Surgery   Contact information:   [address] [address] [phone]       Follow up with [facility] TOM, MD. Schedule an appointment as soon as possible for a visit in 2 weeks.   Specialty:  Family Medicine   Contact information:   [address] [place] [address] [phone]      Discharge Diagnoses:  1. Acute cholecystitis 2. Diabetes mellitus type 2 3. Hypertension 4. Tobacco dependence  Discharge Condition: Improved Disposition: Home  Diet recommendation: Diabetic diet  Filed Weights   09/23/13 0445 09/23/13 1500 09/25/13 0906  Weight: 99.791 kg (220 lb) 105 kg (231 lb 7.7 oz) 104.781 kg (231 lb)    History of present illness:  68 year old man presented with worsening right upper quadrant abdominal pain. Further workup revealed acute cholecystitis.   Hospital Course:  [name] was seen by general surgery and gastroenterology for elevated bilirubin but MRCP was without evidence of CBD stones. He underwent laparoscopic cholecystectomy 9/2 which revealed gangrenous gallbladder. Postoperative course uneventful and he is now stable for discharge.  1. Acute cholecystitis, doing well status post cholecystectomy. Afebrile, no leukocytosis. 2. Diabetes mellitus without complication. Remained stable. 3. Hypertension. Stable. 4. Tobacco dependence. Cessation recommended Continues to improve. Discussed with Dr. Arnoldo Ballard, stable for discharge. Complete antibiotics as an outpatient.  Dr. Arnoldo Ballard will arrange  followup with him.  Consultants:  General surgery Procedures:  Cholecystectomy 9/2 Antibiotics:  Unasyn 8/31 >> 9/4  Augmentin 9/4 >> 9/8  Discharge Instructions  Discharge Instructions   Diet - low sodium heart healthy    Complete by:  As directed      Discharge instructions    Complete by:  As directed   Call your physician or seek immediate medical attention for fever, recurrent pain, vomiting, drainage from wounds or worsening of condition. Activity and wound care instructions per Dr. Arnoldo Ballard.     Increase activity slowly    Complete by:  As directed           Current Discharge Medication List    START taking these medications   Details  amoxicillin-clavulanate (AUGMENTIN) 875-125 MG per tablet Take 1 tablet by mouth every 12 (twelve) hours. Qty: 10 tablet, Refills: 0    HYDROcodone-acetaminophen (NORCO/VICODIN) 5-325 MG per tablet Take 1 tablet by mouth every 4 (four) hours as needed for moderate pain. Qty: 30 tablet, Refills: 0      CONTINUE these medications which have NOT CHANGED   Details  aspirin 81 MG tablet Take 81 mg by mouth daily.    CINNAMON PO Take 1 capsule by mouth every other day.    Cyanocobalamin (VITAMIN B 12 PO) Take 1 tablet by mouth daily.    lisinopril (PRINIVIL,ZESTRIL) 20 MG tablet Take 20 mg by mouth daily.    naproxen sodium (ANAPROX) 220 MG tablet Take 440 mg by mouth daily as needed (pain).    ranitidine (ZANTAC) 150 MG tablet Take 150 mg by mouth daily as needed for heartburn.      STOP taking these medications     oxyCODONE-acetaminophen (PERCOCET/ROXICET) 5-325 MG  per tablet      naproxen (NAPROSYN) 500 MG tablet        No Known Allergies  The results of significant diagnostics from this hospitalization (including imaging, microbiology, ancillary and laboratory) are listed below for reference.    Significant Diagnostic Studies: Mr 3d Recon At Scanner  2013/10/16   CLINICAL DATA:  Jaundice  EXAM: MRI ABDOMEN WITHOUT AND  WITH CONTRAST (INCLUDING MRCP)  TECHNIQUE: Multiplanar multisequence MR imaging of the abdomen was performed both before and after the administration of intravenous contrast. Heavily T2-weighted images of the biliary and pancreatic ducts were obtained, and three-dimensional MRCP images were rendered by post processing.  CONTRAST:  70mL MULTIHANCE GADOBENATE DIMEGLUMINE 529 MG/ML IV SOLN  COMPARISON:  CT abdomen pelvis dated 01/25/2009  FINDINGS: Motion degraded images.  Trace pleural effusions with patchy opacities in the bilateral lower lobes, likely atelectasis.  Liver is within normal limits. No suspicious/enhancing hepatic lesions. No hepatic steatosis.  Spleen pancreas, and adrenal glands are within normal limits.  Cholelithiasis with gallbladder distention, gallbladder wall thickening/inflammatory changes, and pericholecystic fluid, compatible with acute cholecystitis.  No intrahepatic or extrahepatic ductal dilatation. Common duct measures 4 mm. No choledocholithiasis is seen.  4.5 cm left lower pole renal cyst. Kidneys are otherwise within normal limits. No hydronephrosis.  Upper abdominal ascites.  No suspicious abdominal lymphadenopathy.  No focal osseous lesions.  IMPRESSION: Cholelithiasis with acute cholecystitis.  No evidence of choledocholithiasis.   Electronically Signed   By: Julian Hy M.D.   On: 16-Oct-2013 10:24   Dg Chest Portable 1 View  09/22/2013   CLINICAL DATA:  CHEST PAIN  EXAM: PORTABLE CHEST - 1 VIEW  COMPARISON:  03/03/2012  FINDINGS: Lungs are clear. Heart size and mediastinal contours are within normal limits. No effusion. Visualized skeletal structures are unremarkable.  IMPRESSION: No acute cardiopulmonary disease.   Electronically Signed   By: Arne Cleveland M.D.   On: 09/22/2013 22:53   US Abdomen Limited Ruq  09/23/2013   CLINICAL DATA:  Right upper quadrant pain  EXAM: US ABDOMEN LIMITED - RIGHT UPPER QUADRANT  COMPARISON:  None.  FINDINGS: Gallbladder:  The  gallbladder is distended and contains a large amount of echogenic material (some of which shadows) consistent with sludge and small stones. The gallbladder wall is thickened at 4 mm. There is no definite pericholecystic fluid. There is a positive sonographic Murphy's sign.  Common bile duct:  Diameter: 4.7 mm  Liver:  The liver exhibits normal echotexture with no focal mass nor ductal dilation.  IMPRESSION: Findings are consistent with acute cholecystitis with cholelithiasis.   Electronically Signed   By: David  Martinique   On: 09/23/2013 08:14    Microbiology: Recent Results (from the past 240 hour(s))  SURGICAL PCR SCREEN     Status: None   Collection Time    10-16-2013  2:13 PM      Result Value Ref Range Status   MRSA, PCR NEGATIVE  NEGATIVE Final   Staphylococcus aureus NEGATIVE  NEGATIVE Final   Comment:            The Xpert SA Assay (FDA     approved for NASAL specimens     in patients over 55 years of age),     is one component of     a comprehensive surveillance     program.  Test performance has     been validated by [employer] for patients greater     than or  equal to 42 year old.     It is not intended     to diagnose infection nor to     guide or monitor treatment.     Labs: Basic Metabolic Panel:  Recent Labs Lab 09/23/13 0520 09/24/13 0638 09/25/13 0547 09/26/13 0542 09/27/13 0537  NA 135* 134* 137 137 140  K 3.5* 4.6 4.0 4.0 3.4*  CL 99 99 100 100 103  CO2 22 24 24 27 28   GLUCOSE 250* 203* 132* 105* 102*  BUN 8 19 28* 23 16  CREATININE 0.69 0.85 0.87 0.81 0.75  CALCIUM 8.5 8.5 8.5 8.2* 7.9*  MG  --   --   --  2.3  --   PHOS  --   --   --  1.8*  --    Liver Function Tests:  Recent Labs Lab 09/23/13 0520 09/24/13 0638 09/25/13 0547 09/26/13 0542 09/27/13 0537  AST 16 43* 23 30 51*  ALT 12 47 33 30 41  ALKPHOS 54 86 106 98 75  BILITOT 1.2 4.6* 2.4* 1.1 0.7  PROT 7.0 6.5 6.4 6.0 5.6*  ALBUMIN 3.6 2.9* 2.6* 2.3* 2.1*    Recent Labs Lab  09/23/13 0520  LIPASE 17   CBC:  Recent Labs Lab 09/22/13 2245 09/23/13 0520 09/24/13 0638 09/25/13 0547 09/26/13 0542 09/27/13 0537  WBC 9.5 10.6* 19.2* 16.3* 9.6 6.7  NEUTROABS 7.4 8.8*  --   --   --   --   HGB 13.4 14.0 15.3 14.7 13.0 11.9*  HCT 37.8* 39.0 43.4 41.5 36.9* 33.9*  MCV 93.3 92.4 93.9 93.9 94.9 93.4  PLT 121* 136* 170 201 187 169   Cardiac Enzymes:  Recent Labs Lab 09/22/13 2245 09/23/13 0520 09/23/13 1630 09/23/13 2146  TROPONINI <0.30 <0.30 <0.30 <0.30   CBG:  Recent Labs Lab 09/26/13 0721 09/26/13 1114 09/26/13 1626 09/26/13 2131 09/27/13 0821  GLUCAP 119* 138* 105* 109* 90    Principal Problem:   Acute cholecystitis Active Problems:   Diabetes mellitus without complication   Hypertension   Smoker   Symptomatic cholelithiasis   Leukocytosis   Time coordinating discharge: 25 minutes  Signed:  Murray Hodgkins, MD [facility] 09/27/2013, 11:44 AM

## 2013-09-27 NOTE — Care Management Note (Signed)
    Page 1 of 1   09/27/2013     12:08:55 PM CARE MANAGEMENT NOTE 09/27/2013  Patient:  Scott Ballard, Scott Ballard   Account Number:  [id number]  Date Initiated:  09/27/2013  Documentation initiated by:  Jolene Provost  Subjective/Objective Assessment:   Pt is from home with self care. Pt has no HH services, DME's or medication needs prior to admission.     Action/Plan:   Pt plans to discharge home with self care today. No CM needs at this time.   Anticipated DC Date:  09/27/2013   Anticipated DC Plan:  [place]  CM consult      Choice offered to / List presented to:             Status of service:  Completed, signed off Medicare Important Message given?  YES (If response is "NO", the following Medicare IM given date fields will be blank) Date Medicare IM given:  09/27/2013 Medicare IM given by:  Jolene Provost Date Additional Medicare IM given:   Additional Medicare IM given by:    Discharge Disposition:  HOME/SELF CARE  Per UR Regulation:    If discussed at Long Length of Stay Meetings, dates discussed:    Comments:  09/27/2013 [place], RN, MSN, [facility]

## 2013-09-27 NOTE — Progress Notes (Signed)
Ur review complete.  

## 2013-09-27 NOTE — Progress Notes (Signed)
Patient being d/c home with wife. Verbalizes understanding of instructions and follow up appointments. IV cath removed and intact. No pain/swelling at site. No c/o pain at this time.

## 2013-09-27 NOTE — Progress Notes (Signed)
  PROGRESS NOTE  Scott Ballard ERX:[id number] DOB: 26-Jun-1945 DOA: 09/23/2013 PCP: Odette Fraction, MD  Summary: 68 year old man presented with worsening right upper quadrant abdominal pain. Further workup revealed acute cholecystitis. He was seen by general surgery and gastroenterology for elevated bilirubin but MRCP without evidence of CBD stones. He underwent laparoscopic cholecystectomy 9/2 which revealed gangrenous gallbladder.  Assessment/Plan: 1. Acute cholecystitis, doing well status post cholecystectomy. Afebrile, no leukocytosis. 2. Diabetes mellitus without complication. Remained stable. 3. Hypertension. Stable. 4. Tobacco dependence. Cessation recommended   Continues to improve. Discussed with Dr. Ardis Hughs, stable for discharge. Complete antibiotics as an outpatient.  Dr. Arnoldo Morale will arrange followup with him.  Murray Hodgkins, MD  [facility]  Pager [phone] If 7PM-7AM, please contact night-coverage at [email or web address], password [facility] 09/27/2013, 10:35 AM  LOS: 4 days   Consultants:  General surgery  Procedures:  Cholecystectomy 9/2  Antibiotics:  Unasyn 8/31 >> 9/4  Augmentin 9/4 >> 9/8  HPI/Subjective: He is feeling better. Tolerating diet. Pain controlled. Ready to go home.  Objective: Filed Vitals:   09/26/13 1011 09/26/13 1352 09/26/13 2100 09/27/13 0419  BP: 138/57 125/61 153/69 156/75  Pulse: 68 65 61 69  Temp:  98 F (36.7 C) 99.7 F (37.6 C) 98.4 F (36.9 C)  TempSrc:  Oral Oral Oral  Resp:  20 20 20   Height:      Weight:      SpO2:  96% 95% 94%    Intake/Output Summary (Last 24 hours) at 09/27/13 1035 Last data filed at 09/27/13 0419  Gross per 24 hour  Intake    845 ml  Output    466 ml  Net    379 ml     Filed Weights   09/23/13 0445 09/23/13 1500 09/25/13 0906  Weight: 99.791 kg (220 lb) 105 kg (231 lb 7.7 oz) 104.781 kg (231 lb)    Exam:     Afebrile, vital signs stable. No hypoxia. Gen. Appears calm and  comfortable.  Psych. Alert. Speech fluent and clear.  Cardiovascular. Regular rate and rhythm. No murmur, rub or gallop.  Respiratory. Clear to auscultation bilaterally. No wheezes, rales or rhonchi. Normal respiratory effort.  Abdomen. Soft  Data Reviewed:  Basic metabolic panel notable for potassium 3.4. Bilirubin normal.  Modest anemia likely dilutional in nature.  Scheduled Meds: . ampicillin-sulbactam (UNASYN) IV  3 g Intravenous Q6H  . enoxaparin (LOVENOX) injection  40 mg Subcutaneous Q24H  . insulin aspart  0-15 Units Subcutaneous TID WC  . lisinopril  20 mg Oral Daily  . phosphorus  500 mg Oral TID WC   Continuous Infusions:   Principal Problem:   Acute cholecystitis Active Problems:   Diabetes mellitus without complication   Hypertension   Smoker   Symptomatic cholelithiasis   Leukocytosis

## 2013-11-06 ENCOUNTER — Encounter: Payer: Self-pay | Admitting: Internal Medicine

## 2013-11-06 ENCOUNTER — Telehealth: Payer: Self-pay | Admitting: Gastroenterology

## 2013-11-06 NOTE — Telephone Encounter (Signed)
Offer patient follow up OV in 3 months to consider first ever colonoscopy.

## 2013-11-06 NOTE — Telephone Encounter (Signed)
APPT MADE AND LETTER SENT  °

## 2013-11-26 ENCOUNTER — Encounter: Payer: Self-pay | Admitting: Family Medicine

## 2013-11-26 ENCOUNTER — Encounter (INDEPENDENT_AMBULATORY_CARE_PROVIDER_SITE_OTHER): Payer: Self-pay

## 2013-11-26 ENCOUNTER — Ambulatory Visit (INDEPENDENT_AMBULATORY_CARE_PROVIDER_SITE_OTHER): Payer: 59 | Admitting: Family Medicine

## 2013-11-26 VITALS — BP 126/72 | HR 72 | Temp 97.8°F | Resp 18 | Ht 75.0 in | Wt 229.0 lb

## 2013-11-26 DIAGNOSIS — B86 Scabies: Secondary | ICD-10-CM

## 2013-11-26 MED ORDER — PERMETHRIN 5 % EX CREA: 1.0000 "application " | TOPICAL_CREAM | Freq: Once | CUTANEOUS | Status: DC

## 2013-11-26 MED ORDER — PREDNISONE 20 MG PO TABS: ORAL_TABLET | ORAL | Status: DC

## 2013-11-26 NOTE — Progress Notes (Signed)
   Subjective:    Patient ID: Scott Ballard, male    DOB: 01/02/1946, 68 y.o.   MRN: [id number]  HPI Patient's grandson has scabies. 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 Over the last week he has developed a widespread pruritic rash on his chest his arms his upper abdomen and around his genitals. The rash is characterized by erythematous papules and excoriations. He is concerned that he has been exposed to scabies. There is no rash on his head. There is no rash on his legs. The rash does extend to his back. There is no rash on his hand or in between his fingers however. He denies any other exposure. Past Medical History  Diagnosis Date  . Smoker   . Diabetes mellitus without complication   . Hypertension    Past Surgical History  Procedure Laterality Date  . Knee arthroscopy    . Melanoma excision    . Sinus    . Cholecystectomy N/A 09/25/2013    Procedure: LAPAROSCOPIC CHOLECYSTECTOMY;  Surgeon: Jamesetta So, MD;  Location: [facility];  Service: General;  Laterality: N/A;   Current Outpatient Prescriptions on File Prior to Visit  Medication Sig Dispense Refill  . aspirin 81 MG tablet Take 81 mg by mouth daily.    Marland Kitchen CINNAMON PO Take 1 capsule by mouth every other day.    . Cyanocobalamin (VITAMIN B 12 PO) Take 1 tablet by mouth daily.    Marland Kitchen lisinopril (PRINIVIL,ZESTRIL) 20 MG tablet Take 20 mg by mouth daily.    . naproxen sodium (ANAPROX) 220 MG tablet Take 440 mg by mouth daily as needed (pain).    . ranitidine (ZANTAC) 150 MG tablet Take 150 mg by mouth daily as needed for heartburn.     No current facility-administered medications on file prior to visit.   No Known Allergies History   Social History  . Marital Status: Married    Spouse Name: N/A    Number of Children: N/A  . Years of Education: N/A   Occupational History  . Not on file.   Social History Main Topics  . Smoking status: Current Every Day Smoker    Types: Cigars  .  Smokeless tobacco: Not on file     Comment: chews on cigars  . Alcohol Use: No  . Drug Use: No  . Sexual Activity: Not on file   Other Topics Concern  . Not on file   Social History Narrative      Review of Systems  All other systems reviewed and are negative.      Objective:   Physical Exam  Cardiovascular: Normal rate, regular rhythm and normal heart sounds.   Pulmonary/Chest: Effort normal and breath sounds normal. No respiratory distress. He has no wheezes. He has no rales.  Abdominal: Soft. Bowel sounds are normal. He exhibits no distension. There is no tenderness. There is no rebound.  Skin: Rash noted. There is erythema.  Vitals reviewed.         Assessment & Plan:  Scabies infestation - Plan: permethrin (ELIMITE) 5 % cream, predniSONE (DELTASONE) 20 MG tablet  Begin prednisone taper pack for severe itching and rash. Also prescribed Elimite cream to be applied head to toe and rinsed off after 8 hours. I recommended that they wash all bed clothes, linens, garments, etc.  Recheck in one week or sooner if worse.

## 2014-02-03 ENCOUNTER — Telehealth: Payer: Self-pay | Admitting: Family Medicine

## 2014-02-03 NOTE — Telephone Encounter (Signed)
Patient calling to say that his diabetic tester is not working properly, would like to know what he can do about it  [phone]

## 2014-02-04 ENCOUNTER — Ambulatory Visit (INDEPENDENT_AMBULATORY_CARE_PROVIDER_SITE_OTHER): Payer: 59 | Admitting: Gastroenterology

## 2014-02-04 ENCOUNTER — Encounter: Payer: Self-pay | Admitting: Gastroenterology

## 2014-02-04 VITALS — BP 154/79 | HR 59 | Temp 97.2°F | Ht 77.0 in | Wt 236.2 lb

## 2014-02-04 DIAGNOSIS — R7989 Other specified abnormal findings of blood chemistry: Secondary | ICD-10-CM | POA: Diagnosis not present

## 2014-02-04 DIAGNOSIS — R945 Abnormal results of liver function studies: Secondary | ICD-10-CM | POA: Insufficient documentation

## 2014-02-04 DIAGNOSIS — Z1211 Encounter for screening for malignant neoplasm of colon: Secondary | ICD-10-CM | POA: Diagnosis not present

## 2014-02-04 NOTE — Telephone Encounter (Signed)
Pt's meter is not working correctly and would like a new one.  New meter called to pharm - [facility]uc[facility]

## 2014-02-04 NOTE — Progress Notes (Signed)
Primary Care Physician:  Odette Fraction, MD  Primary Gastroenterologist:  Barney Drain, MD   Chief Complaint  Patient presents with  . Follow-up    HPI:  Scott Ballard is a 69 y.o. male here to consider first ever colonoscopy. He was seen in the hospital back in August with acute cholecystitis/cholelithiasis with a bump in his bilirubin. MRCP was negative for choledocholithiasis. 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 At time of discharge his AST was 51 essentially unchanged, ALT 41, total bilirubin normalized from 4.6 to0.7, alkaline phosphatase 75.  Has noted some postprandial loose stools since surgery but only has problem every couple of days. Does not have a bowel movement every day. No melena, brbpr. No heartburn. No dysphagia, no abdominal pain.   No prior colonoscopy. He is not sure whether or not he wants to have a colonoscopy.  Current Outpatient Prescriptions  Medication Sig Dispense Refill  . aspirin 81 MG tablet Take 81 mg by mouth daily.    Marland Kitchen CINNAMON PO Take 1 capsule by mouth every other day.    . Cyanocobalamin (VITAMIN B 12 PO) Take 1 tablet by mouth daily.    Marland Kitchen lisinopril (PRINIVIL,ZESTRIL) 20 MG tablet Take 20 mg by mouth daily.    . naproxen sodium (ANAPROX) 220 MG tablet Take 440 mg by mouth daily as needed (pain).    . ranitidine (ZANTAC) 150 MG tablet Take 150 mg by mouth daily as needed for heartburn.     No current facility-administered medications for this visit.    Allergies as of 02/04/2014  . (No Known Allergies)    Past Medical History  Diagnosis Date  . Smoker   . Diabetes mellitus without complication   . Hypertension     Past Surgical History  Procedure Laterality Date  . Knee arthroscopy    . Melanoma excision    . Sinus    . Cholecystectomy N/A 09/25/2013    Procedure: LAPAROSCOPIC CHOLECYSTECTOMY;  Surgeon: Jamesetta So, MD;  Location: [facility];  Service: General;  Laterality: N/A;    Family History   Problem Relation Age of Onset  . Colon cancer Neg Hx     History   Social History  . Marital Status: Married    Spouse Name: N/A    Number of Children: N/A  . Years of Education: N/A   Occupational History  . Not on file.   Social History Main Topics  . Smoking status: Current Every Day Smoker    Types: Cigars  . Smokeless tobacco: Not on file     Comment: chews on cigars  . Alcohol Use: No  . Drug Use: No  . Sexual Activity: Not on file   Other Topics Concern  . Not on file   Social History Narrative      ROS:  General: Negative for anorexia, weight loss, fever, chills, fatigue, weakness. Eyes: Negative for vision changes.  ENT: Negative for hoarseness, difficulty swallowing , nasal congestion. CV: Negative for chest pain, angina, palpitations, dyspnea on exertion, peripheral edema.  Respiratory: Negative for dyspnea at rest, dyspnea on exertion, cough, sputum, wheezing.  GI: See history of present illness. GU:  Negative for dysuria, hematuria, urinary incontinence, urinary frequency, nocturnal urination.  MS: Negative for joint pain, low back pain.  Derm: Negative for rash or itching.  Neuro: Negative for weakness, abnormal sensation, seizure, frequent headaches, memory loss, confusion.  Psych: Negative for anxiety, depression, suicidal ideation, hallucinations.  Endo: Negative for unusual weight  change.  Heme: Negative for bruising or bleeding. Allergy: Negative for rash or hives.    Physical Examination:  BP 154/79 mmHg  Pulse 59  Temp(Src) 97.2 F (36.2 C) (Oral)  Ht 6\' 5"  (1.956 m)  Wt 236 lb 3.2 oz (107.14 kg)  BMI 28.00 kg/m2   General: Well-nourished, well-developed in no acute distress.  Head: Normocephalic, atraumatic.   Eyes: Conjunctiva pink, no icterus. Mouth: Oropharyngeal mucosa moist and pink , no lesions erythema or exudate. Neck: Supple without thyromegaly, masses, or lymphadenopathy.  Lungs: Clear to auscultation bilaterally.   Heart: Regular rate and rhythm, no murmurs rubs or gallops.  Abdomen: Bowel sounds are normal, nontender, nondistended, no hepatosplenomegaly or masses, no abdominal bruits or    hernia , no rebound or guarding.   Rectal: Not performed Extremities: No lower extremity edema. No clubbing or deformities.  Neuro: Alert and oriented x 4 , grossly normal neurologically.  Skin: Warm and dry, no rash or jaundice.   Psych: Alert and cooperative, normal mood and affect.   Imaging Studies: No results found.

## 2014-02-04 NOTE — Patient Instructions (Signed)
1. Please collect stool specimen, we can check for blood in stool. If positive, you should really consider a colonoscopy. 2. You can talk with Dr. Dennard Schaumann as planned about colon cancer screening. Best option is colonoscopy, because we can prevent colon cancer if polyps are removed early enough. However, you can have a DNA test to look for colon cancer which involves collecting stool specimen for special test. There is also the option of a virtual colonoscopy which involves a special CT scan of your colon. Both of these latter two test, if positive, would lead to a colonoscopy. 3. You should have your LFTs checked again to make sure normal. They were still elevated at time of your discharge in 09/2013. You can have this done with Dr. Dennard Schaumann as you have requested.   Please call us and let us know what you decide about colon cancer screening.

## 2014-02-04 NOTE — Assessment & Plan Note (Signed)
Discussed options for screening for colon cancer. Colonoscopy is a good tool to screen for colon cancer but also for prevention of colon cancer. Discussed at length with patient. He would like to discuss further with Dr. Dennard Schaumann prior to proceeding. Currently he is asymptomatic. No family history of colon cancer.  With regards to non-invasive testing, virtual colonoscopy would be an option which would still require a bowel preparation and if any abnormalities seen he would require colonoscopy for investigation. Code low guard is also a option for colon cancer detection, requires collection of stool and specialized DNA testing. If positive would also require colonoscopy for further evaluation.  Patient will let us know what he decides. In the meantime I have encouraged him to collect and I FOBT to help him make a decision.

## 2014-02-04 NOTE — Progress Notes (Signed)
cc'ed to pcp °

## 2014-02-04 NOTE — Assessment & Plan Note (Signed)
Recommend recheck of LFTs to ensure they have normalized postoperatively. Patient like to have this done through Dr. Samella Parr office after his next office visit with him. Advised him to discuss with Dr. Dennard Schaumann, information provided on patient's AVS.

## 2014-02-21 ENCOUNTER — Other Ambulatory Visit: Payer: Self-pay | Admitting: Physician Assistant

## 2014-02-21 ENCOUNTER — Encounter: Payer: Self-pay | Admitting: Family Medicine

## 2014-02-21 NOTE — Telephone Encounter (Signed)
Test strips refilled.  Pt NTBS  Letter sent

## 2014-03-30 ENCOUNTER — Other Ambulatory Visit: Payer: Self-pay | Admitting: Physician Assistant

## 2014-03-31 NOTE — Telephone Encounter (Signed)
Medication refilled per protocol. 

## 2014-05-02 ENCOUNTER — Ambulatory Visit (INDEPENDENT_AMBULATORY_CARE_PROVIDER_SITE_OTHER): Payer: 59 | Admitting: Family Medicine

## 2014-05-02 ENCOUNTER — Encounter: Payer: Self-pay | Admitting: Family Medicine

## 2014-05-02 VITALS — BP 144/80 | HR 60 | Temp 98.0°F | Resp 18 | Wt 235.0 lb

## 2014-05-02 DIAGNOSIS — R5382 Chronic fatigue, unspecified: Secondary | ICD-10-CM

## 2014-05-02 DIAGNOSIS — Z125 Encounter for screening for malignant neoplasm of prostate: Secondary | ICD-10-CM

## 2014-05-02 DIAGNOSIS — E119 Type 2 diabetes mellitus without complications: Secondary | ICD-10-CM | POA: Diagnosis not present

## 2014-05-02 DIAGNOSIS — I10 Essential (primary) hypertension: Secondary | ICD-10-CM | POA: Diagnosis not present

## 2014-05-02 LAB — LIPID PANEL
Cholesterol: 150 mg/dL (ref 0–200)
HDL: 44 mg/dL (ref >=40)
LDL CALC: 85 mg/dL (ref 0–99)
TRIGLYCERIDES: 107 mg/dL (ref <150)
Total CHOL/HDL Ratio: 3.4 Ratio
VLDL: 21 mg/dL (ref 0–40)

## 2014-05-02 LAB — COMPLETE METABOLIC PANEL WITH GFR
ALT: 16 U/L (ref 0–53)
AST: 16 U/L (ref 0–37)
Albumin: 4.1 g/dL (ref 3.5–5.2)
Alkaline Phosphatase: 46 U/L (ref 39–117)
BILIRUBIN TOTAL: 0.7 mg/dL (ref 0.2–1.2)
BUN: 11 mg/dL (ref 6–23)
CO2: 21 mEq/L (ref 19–32)
Calcium: 8.8 mg/dL (ref 8.4–10.5)
Chloride: 107 mEq/L (ref 96–112)
Creat: 0.81 mg/dL (ref 0.50–1.35)
GFR, Est African American: >89 mL/min
GFR, Est Non African American: 89 mL/min
Glucose, Bld: 105 mg/dL — ABNORMAL HIGH (ref 70–99)
Potassium: 4.1 mEq/L (ref 3.5–5.3)
Sodium: 140 mEq/L (ref 135–145)
Total Protein: 6.4 g/dL (ref 6.0–8.3)

## 2014-05-02 LAB — CBC WITH DIFFERENTIAL/PLATELET
BASOS ABS: 0 10*3/uL (ref 0.0–0.1)
BASOS PCT: 0 % (ref 0–1)
Eosinophils Absolute: 0.1 10*3/uL (ref 0.0–0.7)
Eosinophils Relative: 2 % (ref 0–5)
HCT: 42.7 % (ref 39.0–52.0)
Hemoglobin: 14.6 g/dL (ref 13.0–17.0)
Lymphocytes Relative: 25 % (ref 12–46)
Lymphs Abs: 1.3 10*3/uL (ref 0.7–4.0)
MCH: 32.3 pg (ref 26.0–34.0)
MCHC: 34.2 g/dL (ref 30.0–36.0)
MCV: 94.5 fL (ref 78.0–100.0)
MPV: 10.3 fL (ref 8.6–12.4)
Monocytes Absolute: 0.5 10*3/uL (ref 0.1–1.0)
Monocytes Relative: 10 % (ref 3–12)
Neutro Abs: 3.3 10*3/uL (ref 1.7–7.7)
Neutrophils Relative %: 63 % (ref 43–77)
Platelets: 179 10*3/uL (ref 150–400)
RBC: 4.52 MIL/uL (ref 4.22–5.81)
RDW: 13.9 % (ref 11.5–15.5)
WBC: 5.2 10*3/uL (ref 4.0–10.5)

## 2014-05-02 LAB — HEMOGLOBIN A1C
HEMOGLOBIN A1C: 6 % — AB (ref <5.7)
MEAN PLASMA GLUCOSE: 126 mg/dL — AB (ref <117)

## 2014-05-02 LAB — TESTOSTERONE: TESTOSTERONE: 271 ng/dL — AB (ref 300–890)

## 2014-05-02 LAB — TSH: TSH: 1.212 u[IU]/mL (ref 0.350–4.500)

## 2014-05-02 MED ORDER — ONETOUCH ULTRA SYSTEM W/DEVICE KIT: PACK | Status: DC

## 2014-05-02 NOTE — Progress Notes (Signed)
Subjective:    Patient ID: Scott Ballard, male    DOB: 11-15-1945, 69 y.o.   MRN: [id number]  HPI Patient is here today for follow-up of his chronic medical problems. He has a history of diabetes mellitus. He also has hypertension. He also smokes. He has no desire to quit smoking. His diabetes sounds well controlled. His fasting blood sugars are typically between 90 and 115. He denies any symptoms of hypoglycemia. He is overdue for a prostate exam. Blood pressure today is slightly elevated at 144/80. Patient denies any chest pain shortness of breath or dyspnea on exertion. He states that his blood pressure is usually much better at home he also complains of severe fatigue. Ever since his gallbladder was removed, the patient states he has had severe fatigue. He denies depression. He denies weight loss. He denies fevers or chills.  Past Medical History  Diagnosis Date  . Smoker   . Diabetes mellitus without complication   . Hypertension    Past Surgical History  Procedure Laterality Date  . Knee arthroscopy    . Melanoma excision    . Sinus    . Cholecystectomy N/A 09/25/2013    Procedure: LAPAROSCOPIC CHOLECYSTECTOMY;  Surgeon: Jamesetta So, MD;  Location: [facility];  Service: General;  Laterality: N/A;   Current Outpatient Prescriptions on File Prior to Visit  Medication Sig Dispense Refill  . aspirin 81 MG tablet Take 81 mg by mouth daily.    Marland Kitchen CINNAMON PO Take 1 capsule by mouth every other day.    . Cyanocobalamin (VITAMIN B 12 PO) Take 1 tablet by mouth daily.    Marland Kitchen lisinopril (PRINIVIL,ZESTRIL) 20 MG tablet TAKE 1 TABLET BY MOUTH EVERY DAY 30 tablet 0  . naproxen sodium (ANAPROX) 220 MG tablet Take 440 mg by mouth daily as needed (pain).    . ranitidine (ZANTAC) 150 MG tablet Take 150 mg by mouth daily as needed for heartburn.     No current facility-administered medications on file prior to visit.   No Known Allergies History   Social History  . Marital Status: Married    Spouse  Name: N/A  . Number of Children: N/A  . Years of Education: N/A   Occupational History  . Not on file.   Social History Main Topics  . Smoking status: Current Every Day Smoker    Types: Cigars  . Smokeless tobacco: Not on file     Comment: chews on cigars  . Alcohol Use: No  . Drug Use: No  . Sexual Activity: Not on file   Other Topics Concern  . Not on file   Social History Narrative      Review of Systems  All other systems reviewed and are negative.      Objective:   Physical Exam  Constitutional: He appears well-developed and well-nourished.  Cardiovascular: Normal rate, regular rhythm, normal heart sounds and intact distal pulses.  Exam reveals no gallop and no friction rub.   No murmur heard. Pulmonary/Chest: Effort normal and breath sounds normal. No respiratory distress. He has no wheezes. He has no rales. He exhibits no tenderness.  Abdominal: Soft. Bowel sounds are normal. He exhibits no distension and no mass. There is no tenderness. There is no rebound and no guarding.  Musculoskeletal: He exhibits no edema.  Vitals reviewed.         Assessment & Plan:  Prostate cancer screening - Plan: PSA, Medicare  Diabetes mellitus type II, controlled - Plan: COMPLETE METABOLIC  PANEL WITH GFR, CBC with Differential/Platelet, Lipid panel, Hemoglobin A1c  Chronic fatigue - Plan: CBC with Differential/Platelet, TSH, Testosterone  Essential hypertension  Patient declines a rectal exam but he will consent to a PSA.  I will also check a fasting lipid panel. Goal LDL cholesterol is less than 100. I encouraged smoking cessation. Due to his chronic fatigue I will check a CBC, TSH, and testosterone. I will also check a hemoglobin A1c. Goal hemoglobin A1c is less than 6.5.  Diabetic foot exam is significant for a pre-ulcerative callus on the left lateral MTP joint. I will refer the patient to a podiatrist as I'm concerned that this is eventually lead to an ulcer and  amputation if not aggressively treated particularly given the patient's neuropathy. His blood pressure is elevated today. I've asked the patient to check his blood pressure more frequently at home and call me if his blood pressures consistently greater than 140/90. I encouraged the patient to follow-up. He has not had an eye exam in 4 years. Patient has an eye doctor that he sees in [place] and he will call and schedule this

## 2014-05-03 LAB — PSA, MEDICARE: PSA: 0.85 ng/mL (ref <=4.00)

## 2014-05-04 ENCOUNTER — Other Ambulatory Visit: Payer: Self-pay | Admitting: Family Medicine

## 2014-06-02 ENCOUNTER — Ambulatory Visit (INDEPENDENT_AMBULATORY_CARE_PROVIDER_SITE_OTHER): Payer: 59 | Admitting: Podiatry

## 2014-06-02 ENCOUNTER — Encounter: Payer: Self-pay | Admitting: Podiatry

## 2014-06-02 VITALS — BP 147/92 | HR 68 | Resp 12

## 2014-06-02 DIAGNOSIS — L853 Xerosis cutis: Secondary | ICD-10-CM | POA: Diagnosis not present

## 2014-06-02 DIAGNOSIS — E0842 Diabetes mellitus due to underlying condition with diabetic polyneuropathy: Secondary | ICD-10-CM

## 2014-06-02 DIAGNOSIS — L84 Corns and callosities: Secondary | ICD-10-CM

## 2014-06-02 NOTE — Progress Notes (Signed)
REVIEWED-NO ADDITIONAL RECOMMENDATIONS. 

## 2014-06-02 NOTE — Progress Notes (Signed)
   Subjective:    Patient ID: Scott Ballard, male    DOB: 1945/11/25, 69 y.o.   MRN: [id number]  HPI  N-DRY/CRACK SKIN L-LT FOOT 1ST MET. D-30 YEARS O-SLOWLY C-SAME NOT WORSE A-NONE T- FOOT POWDER  Patient denies any history of claudication or foot ulcerations  Review of Systems  Skin: Positive for color change.       Objective:   Physical Exam  Orientated 3  Vascular: DP pulses 2/4 bilaterally PT pulses 4/4 bilaterally  Neurological: Ankle reflex equal and reactive bilaterally Vibratory sensation nonreactive bilaterally Sensation to 10 g monofilament wire intact 1/4 bilaterally  Dermatological: Keratoses medial plantar right heel without any fissuring Dry plantar skin MPJ  without any fissuring or open lesions,bilaterally The toenails are any leak trimmed with texture and color changes 6-10  Musculoskeletal: HAV deformities bilaterally Hammertoe second bilaterally     Assessment & Plan:   Assessment: Diabetic peripheral neuropathy Dry skin without fissuring  Plan: Reviewed results of diabetic foot examination today Emphasized the need to apply skin lotion on a daily basis. Okay to use a pumice stone or callus file to gently stand reactive callus  Reappoint yearly or at patient's request

## 2014-06-02 NOTE — Patient Instructions (Signed)
Always wear socks and shoes Apply all-purpose skin lotion twice a day to feet and do not rub in between toes Okay to use a pumice stone or callus file after showering to gently say and calluses on heels and balls of feet  Diabetes and Foot Care Diabetes may cause you to have problems because of poor blood supply (circulation) to your feet and legs. This may cause the skin on your feet to become thinner, break easier, and heal more slowly. Your skin may become dry, and the skin may peel and crack. You may also have nerve damage in your legs and feet causing decreased feeling in them. You may not notice minor injuries to your feet that could lead to infections or more serious problems. Taking care of your feet is one of the most important things you can do for yourself.  HOME CARE INSTRUCTIONS  Wear shoes at all times, even in the house. Do not go barefoot. Bare feet are easily injured.  Check your feet daily for blisters, cuts, and redness. If you cannot see the bottom of your feet, use a mirror or ask someone for help.  Wash your feet with warm water (do not use hot water) and mild soap. Then pat your feet and the areas between your toes until they are completely dry. Do not soak your feet as this can dry your skin.  Apply a moisturizing lotion or petroleum jelly (that does not contain alcohol and is unscented) to the skin on your feet and to dry, brittle toenails. Do not apply lotion between your toes.  Trim your toenails straight across. Do not dig under them or around the cuticle. File the edges of your nails with an emery board or nail file.  Do not cut corns or calluses or try to remove them with medicine.  Wear clean socks or stockings every day. Make sure they are not too tight. Do not wear knee-high stockings since they may decrease blood flow to your legs.  Wear shoes that fit properly and have enough cushioning. To break in new shoes, wear them for just a few hours a day. This  prevents you from injuring your feet. Always look in your shoes before you put them on to be sure there are no objects inside.  Do not cross your legs. This may decrease the blood flow to your feet.  If you find a minor scrape, cut, or break in the skin on your feet, keep it and the skin around it clean and dry. These areas may be cleansed with mild soap and water. Do not cleanse the area with peroxide, alcohol, or iodine.  When you remove an adhesive bandage, be sure not to damage the skin around it.  If you have a wound, look at it several times a day to make sure it is healing.  Do not use heating pads or hot water bottles. They may burn your skin. If you have lost feeling in your feet or legs, you may not know it is happening until it is too late.  Make sure your health care provider performs a complete foot exam at least annually or more often if you have foot problems. Report any cuts, sores, or bruises to your health care provider immediately. SEEK MEDICAL CARE IF:   You have an injury that is not healing.  You have cuts or breaks in the skin.  You have an ingrown nail.  You notice redness on your legs or feet.  You  feel burning or tingling in your legs or feet.  You have pain or cramps in your legs and feet.  Your legs or feet are numb.  Your feet always feel cold. SEEK IMMEDIATE MEDICAL CARE IF:   There is increasing redness, swelling, or pain in or around a wound.  There is a red line that goes up your leg.  Pus is coming from a wound.  You develop a fever or as directed by your health care provider.  You notice a bad smell coming from an ulcer or wound. Document Released: 01/08/2000 Document Revised: 09/12/2012 Document Reviewed: 06/19/2012 [facility] Patient Information 2015 [place], [place]. This information is not intended to replace advice given to you by your health care provider. Make sure you discuss any questions you have with your health care provider.

## 2014-06-03 ENCOUNTER — Encounter: Payer: Self-pay | Admitting: Podiatry

## 2014-07-03 ENCOUNTER — Encounter: Payer: Self-pay | Admitting: Family Medicine

## 2014-11-27 ENCOUNTER — Other Ambulatory Visit: Payer: Self-pay | Admitting: *Deleted

## 2014-11-27 MED ORDER — LISINOPRIL 20 MG PO TABS: 20.0000 mg | ORAL_TABLET | Freq: Every day | ORAL | Status: DC

## 2014-11-27 MED ORDER — GLUCOSE BLOOD VI STRP: ORAL_STRIP | Status: DC

## 2014-11-27 NOTE — Telephone Encounter (Signed)
Received fax requesting refill on Lisinopril and One Touch Ultra Test Stirps.   Medication filled x1 with no refills.   Requires office visit before any further refills can be given.   Letter sent.

## 2015-03-09 ENCOUNTER — Ambulatory Visit (INDEPENDENT_AMBULATORY_CARE_PROVIDER_SITE_OTHER): Payer: 59 | Admitting: Family Medicine

## 2015-03-09 ENCOUNTER — Encounter: Payer: Self-pay | Admitting: Family Medicine

## 2015-03-09 VITALS — BP 144/70 | HR 58 | Temp 98.1°F | Resp 16 | Ht 75.0 in | Wt 236.0 lb

## 2015-03-09 DIAGNOSIS — I10 Essential (primary) hypertension: Secondary | ICD-10-CM

## 2015-03-09 DIAGNOSIS — E119 Type 2 diabetes mellitus without complications: Secondary | ICD-10-CM | POA: Diagnosis not present

## 2015-03-09 LAB — CBC WITH DIFFERENTIAL/PLATELET
Basophils Absolute: 0 10*3/uL (ref 0.0–0.1)
Basophils Relative: 0 % (ref 0–1)
EOS PCT: 2 % (ref 0–5)
Eosinophils Absolute: 0.1 10*3/uL (ref 0.0–0.7)
HCT: 42.6 % (ref 39.0–52.0)
HEMOGLOBIN: 14.6 g/dL (ref 13.0–17.0)
LYMPHS ABS: 1.5 10*3/uL (ref 0.7–4.0)
LYMPHS PCT: 29 % (ref 12–46)
MCH: 32.6 pg (ref 26.0–34.0)
MCHC: 34.3 g/dL (ref 30.0–36.0)
MCV: 95.1 fL (ref 78.0–100.0)
MPV: 10.4 fL (ref 8.6–12.4)
Monocytes Absolute: 0.5 10*3/uL (ref 0.1–1.0)
Monocytes Relative: 10 % (ref 3–12)
NEUTROS ABS: 3.1 10*3/uL (ref 1.7–7.7)
NEUTROS PCT: 59 % (ref 43–77)
Platelets: 165 10*3/uL (ref 150–400)
RBC: 4.48 MIL/uL (ref 4.22–5.81)
RDW: 14 % (ref 11.5–15.5)
WBC: 5.3 10*3/uL (ref 4.0–10.5)

## 2015-03-09 LAB — HEMOGLOBIN A1C
Hgb A1c MFr Bld: 6 % — ABNORMAL HIGH (ref <5.7)
Mean Plasma Glucose: 126 mg/dL — ABNORMAL HIGH (ref <117)

## 2015-03-09 LAB — COMPLETE METABOLIC PANEL WITH GFR
ALT: 13 U/L (ref 9–46)
AST: 14 U/L (ref 10–35)
Albumin: 3.9 g/dL (ref 3.6–5.1)
Alkaline Phosphatase: 42 U/L (ref 40–115)
BILIRUBIN TOTAL: 0.8 mg/dL (ref 0.2–1.2)
BUN: 13 mg/dL (ref 7–25)
CALCIUM: 8.9 mg/dL (ref 8.6–10.3)
CO2: 25 mmol/L (ref 20–31)
CREATININE: 0.81 mg/dL (ref 0.70–1.18)
Chloride: 107 mmol/L (ref 98–110)
GFR, Est Non African American: 89 mL/min (ref >=60)
Glucose, Bld: 98 mg/dL (ref 70–99)
Potassium: 4.2 mmol/L (ref 3.5–5.3)
Sodium: 139 mmol/L (ref 135–146)
TOTAL PROTEIN: 6.3 g/dL (ref 6.1–8.1)

## 2015-03-09 LAB — LIPID PANEL
CHOLESTEROL: 126 mg/dL (ref 125–200)
HDL: 39 mg/dL — ABNORMAL LOW (ref >=40)
LDL CALC: 60 mg/dL (ref <130)
Total CHOL/HDL Ratio: 3.2 Ratio (ref <=5.0)
Triglycerides: 133 mg/dL (ref <150)
VLDL: 27 mg/dL (ref <30)

## 2015-03-09 NOTE — Progress Notes (Signed)
Subjective:    Patient ID: Scott Ballard, male    DOB: 07-19-1945, 70 y.o.   MRN: [id number]  HPI  Patient is here today for follow-up of his chronic medical problems. He has a history of diabetes mellitus. He also has hypertension. He also smokes. He has no desire to quit smoking. Patient denies any chest pain shortness of breath or dyspnea on exertion. Diabetes is well controlled. His blood sugar typically ranges between 90 and 120 fasting. His two-hour postprandial sugars are less than 140. However he does have neuropathy in both feet. He also has a pre-ulcerative callus on the tip of his left third toe.  Past Medical History  Diagnosis Date  . Smoker   . Diabetes mellitus without complication ([place])   . Hypertension    Past Surgical History  Procedure Laterality Date  . Knee arthroscopy    . Melanoma excision    . Sinus    . Cholecystectomy N/A 09/25/2013    Procedure: LAPAROSCOPIC CHOLECYSTECTOMY;  Surgeon: Jamesetta So, MD;  Location: [facility];  Service: General;  Laterality: N/A;   Current Outpatient Prescriptions on File Prior to Visit  Medication Sig Dispense Refill  . aspirin 81 MG tablet Take 81 mg by mouth daily.    . Blood Glucose Monitoring Suppl (ONE TOUCH ULTRA SYSTEM KIT) W/DEVICE KIT Needs strips #50/11 refills- , meter, lancets 100/11 refills DX E11.9 1 each 0  . CINNAMON PO Take 1 capsule by mouth every other day.    . Cyanocobalamin (VITAMIN B 12 PO) Take 1 tablet by mouth daily.    Marland Kitchen glucose blood (ONE TOUCH TEST STRIPS) test strip Use as instructed to monitor FSBS 1x daily. Dx: E11.9 200 each 3  . lisinopril (PRINIVIL,ZESTRIL) 20 MG tablet Take 1 tablet (20 mg total) by mouth daily. 90 tablet 0  . naproxen sodium (ANAPROX) 220 MG tablet Take 440 mg by mouth daily as needed (pain).    . ranitidine (ZANTAC) 150 MG tablet Take 150 mg by mouth daily as needed for heartburn.     No current facility-administered medications on file prior to visit.   No Known  Allergies Social History   Social History  . Marital Status: Married    Spouse Name: N/A  . Number of Children: N/A  . Years of Education: N/A   Occupational History  . Not on file.   Social History Main Topics  . Smoking status: Current Every Day Smoker    Types: Cigars  . Smokeless tobacco: Not on file     Comment: chews on cigars  . Alcohol Use: No  . Drug Use: No  . Sexual Activity: Not on file   Other Topics Concern  . Not on file   Social History Narrative      Review of Systems  All other systems reviewed and are negative.      Objective:   Physical Exam  Constitutional: He appears well-developed and well-nourished.  Cardiovascular: Normal rate, regular rhythm, normal heart sounds and intact distal pulses.  Exam reveals no gallop and no friction rub.   No murmur heard. Pulmonary/Chest: Effort normal and breath sounds normal. No respiratory distress. He has no wheezes. He has no rales. He exhibits no tenderness.  Abdominal: Soft. Bowel sounds are normal. He exhibits no distension and no mass. There is no tenderness. There is no rebound and no guarding.  Musculoskeletal: He exhibits no edema.  Vitals reviewed.         Assessment &  Plan:  Controlled type 2 diabetes mellitus without complication, without long-term current use of insulin ([place]) - Plan: CBC with Differential/Platelet, COMPLETE METABOLIC PANEL WITH GFR, Lipid panel, Hemoglobin A1c  Essential hypertension he is no longer smoking although he does still chew tobacco. I recommended complete abstinence from tobacco products. Immunizations are up-to-date. He is taking an aspirin. His blood pressure is borderline today. I have asked him to check his blood pressure frequently for the next 2 weeks and notify me of the values. If persistently greater than 140, I would increase lisinopril to 40 mg a day. I will check a fasting lipid panel. Goal LDL cholesterol is less than 100. I will also check a hemoglobin  A1c. His goal hemoglobin A1c is less than 6.5. I recommended that the patient had his shoes to take pressure off the tip of his third toe, that he use measures to reduce the pre-ulcerative callus such as filing down with an emery board and applying moisturizers to the thick skin every day.

## 2015-03-11 ENCOUNTER — Encounter: Payer: Self-pay | Admitting: Family Medicine

## 2015-03-13 ENCOUNTER — Ambulatory Visit: Payer: Medicare Other | Admitting: *Deleted

## 2015-03-13 VITALS — BP 130/88

## 2015-03-13 DIAGNOSIS — I10 Essential (primary) hypertension: Secondary | ICD-10-CM

## 2015-03-14 ENCOUNTER — Other Ambulatory Visit: Payer: Self-pay | Admitting: Family Medicine

## 2015-03-16 NOTE — Telephone Encounter (Signed)
Medication refilled per protocol. 

## 2015-03-17 ENCOUNTER — Ambulatory Visit: Payer: Medicare Other | Admitting: Family Medicine

## 2015-03-17 VITALS — BP 136/76

## 2015-03-17 DIAGNOSIS — I10 Essential (primary) hypertension: Secondary | ICD-10-CM

## 2015-06-29 ENCOUNTER — Other Ambulatory Visit: Payer: Self-pay | Admitting: Family Medicine

## 2015-09-18 ENCOUNTER — Other Ambulatory Visit: Payer: Self-pay

## 2015-09-30 ENCOUNTER — Other Ambulatory Visit: Payer: Self-pay | Admitting: Family Medicine

## 2015-10-02 ENCOUNTER — Other Ambulatory Visit: Payer: Self-pay | Admitting: Family Medicine

## 2015-12-14 ENCOUNTER — Other Ambulatory Visit: Payer: Self-pay | Admitting: Family Medicine

## 2015-12-21 DIAGNOSIS — H5203 Hypermetropia, bilateral: Secondary | ICD-10-CM | POA: Diagnosis not present

## 2015-12-21 DIAGNOSIS — H524 Presbyopia: Secondary | ICD-10-CM | POA: Diagnosis not present

## 2015-12-21 DIAGNOSIS — E119 Type 2 diabetes mellitus without complications: Secondary | ICD-10-CM | POA: Diagnosis not present

## 2015-12-21 LAB — HM DIABETES EYE EXAM

## 2015-12-24 ENCOUNTER — Encounter: Payer: Self-pay | Admitting: Family Medicine

## 2016-01-03 ENCOUNTER — Other Ambulatory Visit: Payer: Self-pay | Admitting: Family Medicine

## 2016-02-01 ENCOUNTER — Encounter: Payer: Self-pay | Admitting: Family Medicine

## 2016-02-01 ENCOUNTER — Ambulatory Visit (INDEPENDENT_AMBULATORY_CARE_PROVIDER_SITE_OTHER): Payer: Medicare Other | Admitting: Family Medicine

## 2016-02-01 VITALS — BP 110/70 | HR 56 | Temp 97.7°F | Resp 18 | Ht 75.0 in | Wt 235.0 lb

## 2016-02-01 DIAGNOSIS — Z1211 Encounter for screening for malignant neoplasm of colon: Secondary | ICD-10-CM

## 2016-02-01 DIAGNOSIS — Z23 Encounter for immunization: Secondary | ICD-10-CM | POA: Diagnosis not present

## 2016-02-01 DIAGNOSIS — D485 Neoplasm of uncertain behavior of skin: Secondary | ICD-10-CM

## 2016-02-01 DIAGNOSIS — Z0000 Encounter for general adult medical examination without abnormal findings: Secondary | ICD-10-CM | POA: Diagnosis not present

## 2016-02-01 DIAGNOSIS — I10 Essential (primary) hypertension: Secondary | ICD-10-CM | POA: Diagnosis not present

## 2016-02-01 DIAGNOSIS — R7303 Prediabetes: Secondary | ICD-10-CM | POA: Diagnosis not present

## 2016-02-01 DIAGNOSIS — Z125 Encounter for screening for malignant neoplasm of prostate: Secondary | ICD-10-CM | POA: Diagnosis not present

## 2016-02-01 LAB — CBC WITH DIFFERENTIAL/PLATELET
BASOS ABS: 0 {cells}/uL (ref 0–200)
Basophils Relative: 0 %
EOS PCT: 2 %
Eosinophils Absolute: 124 cells/uL (ref 15–500)
HEMATOCRIT: 43 % (ref 38.5–50.0)
HEMOGLOBIN: 14.5 g/dL (ref 13.0–17.0)
LYMPHS PCT: 25 %
Lymphs Abs: 1550 cells/uL (ref 850–3900)
MCH: 32.4 pg (ref 27.0–33.0)
MCHC: 33.7 g/dL (ref 32.0–36.0)
MCV: 96 fL (ref 80.0–100.0)
MPV: 10.5 fL (ref 7.5–12.5)
Monocytes Absolute: 558 cells/uL (ref 200–950)
Monocytes Relative: 9 %
NEUTROS PCT: 64 %
Neutro Abs: 3968 cells/uL (ref 1500–7800)
Platelets: 173 10*3/uL (ref 140–400)
RBC: 4.48 MIL/uL (ref 4.20–5.80)
RDW: 13.7 % (ref 11.0–15.0)
WBC: 6.2 10*3/uL (ref 3.8–10.8)

## 2016-02-01 LAB — COMPLETE METABOLIC PANEL WITH GFR
ALBUMIN: 3.7 g/dL (ref 3.6–5.1)
ALK PHOS: 51 U/L (ref 40–115)
ALT: 20 U/L (ref 9–46)
AST: 20 U/L (ref 10–35)
BUN: 14 mg/dL (ref 7–25)
CALCIUM: 8.6 mg/dL (ref 8.6–10.3)
CHLORIDE: 107 mmol/L (ref 98–110)
CO2: 24 mmol/L (ref 20–31)
Creat: 0.78 mg/dL (ref 0.70–1.18)
GFR, Est African American: >89 mL/min (ref >=60)
Glucose, Bld: 101 mg/dL — ABNORMAL HIGH (ref 70–99)
POTASSIUM: 4.3 mmol/L (ref 3.5–5.3)
SODIUM: 140 mmol/L (ref 135–146)
Total Bilirubin: 0.6 mg/dL (ref 0.2–1.2)
Total Protein: 6.4 g/dL (ref 6.1–8.1)

## 2016-02-01 LAB — LIPID PANEL
CHOL/HDL RATIO: 3.2 ratio (ref <5.0)
CHOLESTEROL: 135 mg/dL (ref <200)
HDL: 42 mg/dL (ref >40)
LDL Cholesterol: 69 mg/dL (ref <100)
TRIGLYCERIDES: 119 mg/dL (ref <150)
VLDL: 24 mg/dL (ref <30)

## 2016-02-01 LAB — HEMOGLOBIN A1C
Hgb A1c MFr Bld: 5.6 % (ref <5.7)
Mean Plasma Glucose: 114 mg/dL

## 2016-02-01 LAB — PSA: PSA: 1.4 ng/mL (ref <=4.0)

## 2016-02-01 NOTE — Progress Notes (Signed)
Subjective:    Patient ID: Scott Ballard, male    DOB: [birth year], 71 y.o.   MRN: [id number]  HPI Patient is here today for complete physical exam. His blood pressures well controlled. He denies any chest pain shortness of breath or dyspnea on exertion. He is due for prostate cancer screening with a PSA. He is also overdue for colonoscopy. He refuses a colonoscopy. However after a long discussion, he will consent to fecal occult blood cards 3. He is due for a flu shot. He is also due for a booster on Pneumovax 23 as he had a prior to 63. Otherwise he's been doing well with no concerns. Eye exam was recently performed and is normal. He is overdue for hepatitis C screening but he declines this. He also declines the shingles vaccine. I asked the patient to remove his baseball cap. In the center of his scalp, there is a 1 cm pink nodular lesion with a central ulcer/crater consistent with a basal cell carcinoma. This needs immediate attention. I recommended dermatology consultation for excision/biopsy. Past Medical History:  Diagnosis Date  . Diabetes mellitus without complication ([place])   . Hypertension   . Smoker    Past Surgical History:  Procedure Laterality Date  . CHOLECYSTECTOMY N/A 09/25/2013   Procedure: LAPAROSCOPIC CHOLECYSTECTOMY;  Surgeon: Jamesetta So, MD;  Location: [facility];  Service: General;  Laterality: N/A;  . KNEE ARTHROSCOPY    . MELANOMA EXCISION    . sinus     Current Outpatient Prescriptions on File Prior to Visit  Medication Sig Dispense Refill  . aspirin 81 MG tablet Take 81 mg by mouth daily.    . Blood Glucose Monitoring Suppl (ONE TOUCH ULTRA SYSTEM KIT) W/DEVICE KIT Needs strips #50/11 refills- , meter, lancets 100/11 refills DX E11.9 1 each 0  . CINNAMON PO Take 1 capsule by mouth every other day.    . Cyanocobalamin (VITAMIN B 12 PO) Take 1 tablet by mouth daily.    Marland Kitchen lisinopril (PRINIVIL,ZESTRIL) 20 MG tablet TAKE 1 TABLET BY MOUTH EVERY DAY 90 tablet 0  .  lisinopril (PRINIVIL,ZESTRIL) 20 MG tablet TAKE 1 TABLET BY MOUTH EVERY DAY 90 tablet 2  . naproxen sodium (ANAPROX) 220 MG tablet Take 440 mg by mouth daily as needed (pain).    . ONE TOUCH ULTRA TEST test strip USE AS INSTRUCTED TO MONITOR FSBS 1X DAILY. DX: E11.9 200 each 2  . ranitidine (ZANTAC) 150 MG tablet Take 150 mg by mouth daily as needed for heartburn.     No current facility-administered medications on file prior to visit.    No Known Allergies Social History   Social History  . Marital status: Married    Spouse name: N/A  . Number of children: N/A  . Years of education: N/A   Occupational History  . Not on file.   Social History Main Topics  . Smoking status: Current Every Day Smoker    Types: Cigars  . Smokeless tobacco: Not on file     Comment: chews on cigars  . Alcohol use No  . Drug use: No  . Sexual activity: Not on file   Other Topics Concern  . Not on file   Social History Narrative  . No narrative on file   Family History  Problem Relation Age of Onset  . Colon cancer Neg Hx       Review of Systems  All other systems reviewed and are negative.  Objective:   Physical Exam  Constitutional: He is oriented to person, place, and time. He appears well-developed and well-nourished. No distress.  HENT:  Head: Normocephalic and atraumatic.  Right Ear: External ear normal.  Left Ear: External ear normal.  Nose: Nose normal.  Mouth/Throat: Oropharynx is clear and moist. No oropharyngeal exudate.  Eyes: Conjunctivae and EOM are normal. Pupils are equal, round, and reactive to light. Right eye exhibits no discharge. Left eye exhibits no discharge.  Neck: Normal range of motion. No JVD present. No tracheal deviation present. No thyromegaly present.  Cardiovascular: Normal rate, regular rhythm, normal heart sounds and intact distal pulses.  Exam reveals no gallop and no friction rub.   No murmur heard. Pulmonary/Chest: Effort normal and breath  sounds normal. No stridor. No respiratory distress. He has no wheezes. He has no rales. He exhibits no tenderness.  Abdominal: Soft. Bowel sounds are normal. He exhibits no distension and no mass. There is no tenderness. There is no rebound and no guarding.  Musculoskeletal: Normal range of motion. He exhibits no edema, tenderness or deformity.  Lymphadenopathy:    He has no cervical adenopathy.  Neurological: He is alert and oriented to person, place, and time. He displays normal reflexes. No cranial nerve deficit. He exhibits normal muscle tone. Coordination normal.  Skin: Skin is warm. Rash noted. He is not diaphoretic.  Psychiatric: He has a normal mood and affect. His behavior is normal. Judgment and thought content normal.  Vitals reviewed.  See hpi- BCC on scalp       Assessment & Plan:  Prediabetes - Plan: CBC with Differential/Platelet, COMPLETE METABOLIC PANEL WITH GFR, Lipid panel, Hemoglobin A1c, Microalbumin, urine  Prostate cancer screening - Plan: PSA  Colon cancer screening - Plan: Fecal occult blood, imunochemical, Fecal occult blood, imunochemical, Fecal occult blood, imunochemical  Essential hypertension  Routine general medical examination at a health care facility  Neoplasm of uncertain behavior of scalp - Plan: Ambulatory referral to Dermatology  Patient's blood pressures well controlled. He has diet-controlled diabetes mellitus type 2 versus prediabetes. I will check a hemoglobin A1c as well as a urine microalbumin. Also check a fasting lipid panel. His diabetic eye exam is up-to-date. I recommended a colonoscopy but he declines. He will consent to fecal occult blood cards 3. I will screen for prostate cancer with a PSA. Prevnar 13 is up-to-date. He is due for a booster on Pneumovax 23 but he like to come back and get that at another time. After a long discussion, the patient will consent to receive a flu shot today. He declines the shingles vaccine. He declines  hepatitis C screening. I will consult dermatology as I believe he has a basal cell carcinoma on his scalp that requires excision and biopsy.

## 2016-02-01 NOTE — Addendum Note (Signed)
Addended by: Shary Decamp B on: 02/01/2016 10:40 AM   Modules accepted: Orders

## 2016-02-02 ENCOUNTER — Encounter: Payer: Self-pay | Admitting: *Deleted

## 2016-02-05 ENCOUNTER — Ambulatory Visit (INDEPENDENT_AMBULATORY_CARE_PROVIDER_SITE_OTHER): Payer: Medicare Other

## 2016-02-05 DIAGNOSIS — Z1211 Encounter for screening for malignant neoplasm of colon: Secondary | ICD-10-CM | POA: Diagnosis not present

## 2016-02-05 DIAGNOSIS — Z23 Encounter for immunization: Secondary | ICD-10-CM

## 2016-02-06 LAB — FECAL OCCULT BLOOD, IMMUNOCHEMICAL
FECAL OCCULT BLOOD: NEGATIVE
Fecal Occult Blood: NEGATIVE
Fecal Occult Blood: POSITIVE — AB

## 2016-02-08 ENCOUNTER — Other Ambulatory Visit: Payer: Self-pay | Admitting: Family Medicine

## 2016-02-08 DIAGNOSIS — Z1211 Encounter for screening for malignant neoplasm of colon: Secondary | ICD-10-CM

## 2016-02-08 DIAGNOSIS — K921 Melena: Secondary | ICD-10-CM

## 2016-02-15 ENCOUNTER — Encounter: Payer: Self-pay | Admitting: Gastroenterology

## 2016-03-10 ENCOUNTER — Ambulatory Visit (INDEPENDENT_AMBULATORY_CARE_PROVIDER_SITE_OTHER): Payer: Medicare Other | Admitting: Gastroenterology

## 2016-03-10 ENCOUNTER — Encounter: Payer: Self-pay | Admitting: Gastroenterology

## 2016-03-10 ENCOUNTER — Other Ambulatory Visit: Payer: Self-pay

## 2016-03-10 ENCOUNTER — Telehealth: Payer: Self-pay

## 2016-03-10 ENCOUNTER — Encounter (INDEPENDENT_AMBULATORY_CARE_PROVIDER_SITE_OTHER): Payer: Self-pay

## 2016-03-10 DIAGNOSIS — R195 Other fecal abnormalities: Secondary | ICD-10-CM | POA: Diagnosis not present

## 2016-03-10 MED ORDER — PEG 3350-KCL-NA BICARB-NACL 420 G PO SOLR: 4000.0000 mL | ORAL | 0 refills | Status: DC

## 2016-03-10 NOTE — Patient Instructions (Addendum)
1. Colonoscopy as scheduled. Please see separate instructions. 

## 2016-03-10 NOTE — Telephone Encounter (Signed)
Pt called to change his TCS date. New paper work will be mail out for him.

## 2016-03-10 NOTE — Progress Notes (Addendum)
Primary Care Physician:  Odette Fraction, MD  Primary Gastroenterologist:  Barney Drain, MD    Chief Complaint  Patient presents with  . Rectal Bleeding    + blood in stool, never had TCS    HPI:  Scott Ballard is a 71 y.o. male here For further evaluation of Hemoccult-positive stools. We saw the patient back in January 2016, offered first ever colonoscopy at that time he declined. Clinically he feels well. Bowel movements are regular. No blood in the stool or melena that he can see. No abdominal pain. No heartburn, dysphagia, vomiting. Patient's weight has been very stable the past couple of years. He states when he first was diagnosed with diabetes years ago he weighed 285 pounds. He lost 60 pounds but choosing to only eat when he was hungry. Now he is considered to be either diet-controlled diabetic or prediabetic. No longer on medication.   Current Outpatient Prescriptions  Medication Sig Dispense Refill  . aspirin 81 MG tablet Take 81 mg by mouth daily.    . Blood Glucose Monitoring Suppl (ONE TOUCH ULTRA SYSTEM KIT) W/DEVICE KIT Needs strips #50/11 refills- , meter, lancets 100/11 refills DX E11.9 1 each 0  . CINNAMON PO Take 1 capsule by mouth every other day.    . Cyanocobalamin (VITAMIN B 12 PO) Take 1 tablet by mouth daily.    Marland Kitchen lisinopril (PRINIVIL,ZESTRIL) 20 MG tablet TAKE 1 TABLET BY MOUTH EVERY DAY 90 tablet 2  . naproxen sodium (ANAPROX) 220 MG tablet Take 440 mg by mouth daily as needed (pain).    . ONE TOUCH ULTRA TEST test strip USE AS INSTRUCTED TO MONITOR FSBS 1X DAILY. DX: E11.9 200 each 2   No current facility-administered medications for this visit.     Allergies as of 03/10/2016  . (No Known Allergies)    Past Medical History:  Diagnosis Date  . Diabetes mellitus without complication ([place])    Diet controlled versus prediabetic, no longer on medication  . Hypertension   . Smoker     Past Surgical History:  Procedure Laterality Date  . CHOLECYSTECTOMY  N/A 09/25/2013   Procedure: LAPAROSCOPIC CHOLECYSTECTOMY;  Surgeon: Jamesetta So, MD;  Location: [facility];  Service: General;  Laterality: N/A;  . KNEE ARTHROSCOPY    . MELANOMA EXCISION    . sinus      Family History  Problem Relation Age of Onset  . Colon cancer Neg Hx     Social History   Social History  . Marital status: Married    Spouse name: N/A  . Number of children: N/A  . Years of education: N/A   Occupational History  . Not on file.   Social History Main Topics  . Smoking status: Current Every Day Smoker    Types: Cigars  . Smokeless tobacco: Never Used     Comment: chews on cigars  . Alcohol use No  . Drug use: No  . Sexual activity: Not on file   Other Topics Concern  . Not on file   Social History Narrative  . No narrative on file      ROS:  General: Negative for anorexia, weight loss, fever, chills, fatigue, weakness. Eyes: Negative for vision changes.  ENT: Negative for hoarseness, difficulty swallowing , nasal congestion. CV: Negative for chest pain, angina, palpitations, dyspnea on exertion, peripheral edema.  Respiratory: Negative for dyspnea at rest, dyspnea on exertion, cough, sputum, wheezing.  GI: See history of present illness. GU:  Negative for dysuria,  hematuria, urinary incontinence, urinary frequency, nocturnal urination.  MS: Negative for joint pain, low back pain.  Derm: Negative for rash or itching.  Neuro: Negative for weakness, abnormal sensation, seizure, frequent headaches, memory loss, confusion.  Psych: Negative for anxiety, depression, suicidal ideation, hallucinations.  Endo: Negative for unusual weight change.  Heme: Negative for bruising or bleeding. Allergy: Negative for rash or hives.    Physical Examination:  BP 126/73   Pulse (!) 56   Temp 97.8 F (36.6 C) (Oral)   Ht '6\' 6"'  (1.981 m)   Wt 231 lb 9.6 oz (105.1 kg)   BMI 26.76 kg/m    General: Well-nourished, well-developed in no acute distress.  Head:  Normocephalic, atraumatic.   Eyes: Conjunctiva pink, no icterus. Mouth: Oropharyngeal mucosa moist and pink , no lesions erythema or exudate. Neck: Supple without thyromegaly, masses, or lymphadenopathy.  Lungs: Clear to auscultation bilaterally.  Heart: Regular rate and rhythm, no murmurs rubs or gallops.  Abdomen: Bowel sounds are normal, nontender, nondistended, no hepatosplenomegaly or masses, no abdominal bruits or    hernia , no rebound or guarding.   Rectal: Not performed Extremities: No lower extremity edema. No clubbing or deformities.  Neuro: Alert and oriented x 4 , grossly normal neurologically.  Skin: Warm and dry, no rash or jaundice.   Psych: Alert and cooperative, normal mood and affect.  Labs: Lab Results  Component Value Date   ALT 20 02/01/2016   AST 20 02/01/2016   ALKPHOS 51 02/01/2016   BILITOT 0.6 02/01/2016   Lab Results  Component Value Date   CREATININE 0.78 02/01/2016   BUN 14 02/01/2016   NA 140 02/01/2016   K 4.3 02/01/2016   CL 107 02/01/2016   CO2 24 02/01/2016   Lab Results  Component Value Date   WBC 6.2 02/01/2016   HGB 14.5 02/01/2016   HCT 43.0 02/01/2016   MCV 96.0 02/01/2016   PLT 173 02/01/2016    Lab Results  Component Value Date   HGBA1C 5.6 02/01/2016    Imaging Studies: No results found.

## 2016-03-10 NOTE — Assessment & Plan Note (Signed)
71 year old gentleman with no prior history of colonoscopy, no GI symptoms recently underwent Hemoccult testing, one of 3 was positive. No evidence of anemia. No family history of colon cancer. Recommend colonoscopy at this time.  I have discussed the risks, alternatives, benefits with regards to but not limited to the risk of reaction to medication, bleeding, infection, perforation and the patient is agreeable to proceed. Written consent to be obtained.

## 2016-03-10 NOTE — Progress Notes (Signed)
cc'ed to pcp °

## 2016-03-10 NOTE — Telephone Encounter (Signed)
Noted  

## 2016-03-24 ENCOUNTER — Telehealth: Payer: Self-pay

## 2016-03-24 NOTE — Telephone Encounter (Signed)
Noted  

## 2016-03-24 NOTE — Telephone Encounter (Signed)
Pt is calling to reschedule his TCS from 03/25/16 to 04/11/16 @ 1:00. New instructions are in the mail.

## 2016-04-08 ENCOUNTER — Telehealth: Payer: Self-pay

## 2016-04-08 NOTE — Telephone Encounter (Signed)
Sorry he was on [facility] schedule for Tuesday

## 2016-04-08 NOTE — Telephone Encounter (Signed)
Pt called and said that he was stuck in [facility] and could not get back in time for his TCS.

## 2016-04-08 NOTE — Telephone Encounter (Signed)
When was he scheduled?

## 2016-04-11 ENCOUNTER — Encounter (HOSPITAL_COMMUNITY): Admission: RE | Payer: Self-pay | Source: Ambulatory Visit

## 2016-04-11 ENCOUNTER — Ambulatory Visit (HOSPITAL_COMMUNITY): Admission: RE | Admit: 2016-04-11 | Payer: Medicare Other | Source: Ambulatory Visit | Admitting: Gastroenterology

## 2016-04-11 SURGERY — COLONOSCOPY
Anesthesia: Moderate Sedation

## 2016-04-12 NOTE — Telephone Encounter (Signed)
Make sure we try to get him rescheduled. Thanks!

## 2016-04-18 NOTE — Telephone Encounter (Signed)
Letter mailed for him to call us

## 2016-06-16 NOTE — Progress Notes (Signed)
REVIEWED-NO ADDITIONAL RECOMMENDATIONS. 

## 2016-06-16 NOTE — Telephone Encounter (Signed)
NEEDS OPV BEFORE RESCHEDULING HIS TCS.

## 2016-06-16 NOTE — Telephone Encounter (Signed)
Left message with family for him to call back

## 2016-06-21 NOTE — Telephone Encounter (Signed)
Pt said that he would have to wait until he finished up the job he was doing and he would call us back.

## 2016-09-26 ENCOUNTER — Other Ambulatory Visit: Payer: Self-pay | Admitting: Family Medicine

## 2016-12-29 ENCOUNTER — Other Ambulatory Visit: Payer: Self-pay

## 2017-04-06 ENCOUNTER — Other Ambulatory Visit: Payer: Self-pay | Admitting: *Deleted

## 2017-04-06 MED ORDER — LISINOPRIL 20 MG PO TABS: 20.0000 mg | ORAL_TABLET | Freq: Every day | ORAL | 2 refills | Status: DC

## 2017-05-20 ENCOUNTER — Emergency Department (HOSPITAL_COMMUNITY): Payer: Medicare Other

## 2017-05-20 ENCOUNTER — Encounter (HOSPITAL_COMMUNITY): Payer: Self-pay

## 2017-05-20 ENCOUNTER — Emergency Department (HOSPITAL_COMMUNITY)
Admission: EM | Admit: 2017-05-20 | Discharge: 2017-05-20 | Disposition: A | Discharge status: Home / Self Care | Payer: Medicare Other | Attending: Emergency Medicine | Admitting: Emergency Medicine

## 2017-05-20 ENCOUNTER — Other Ambulatory Visit: Payer: Self-pay

## 2017-05-20 DIAGNOSIS — F17210 Nicotine dependence, cigarettes, uncomplicated: Secondary | ICD-10-CM | POA: Insufficient documentation

## 2017-05-20 DIAGNOSIS — R42 Dizziness and giddiness: Secondary | ICD-10-CM | POA: Diagnosis not present

## 2017-05-20 DIAGNOSIS — I10 Essential (primary) hypertension: Secondary | ICD-10-CM | POA: Diagnosis not present

## 2017-05-20 DIAGNOSIS — E119 Type 2 diabetes mellitus without complications: Secondary | ICD-10-CM | POA: Insufficient documentation

## 2017-05-20 DIAGNOSIS — H81399 Other peripheral vertigo, unspecified ear: Secondary | ICD-10-CM | POA: Insufficient documentation

## 2017-05-20 DIAGNOSIS — Z7982 Long term (current) use of aspirin: Secondary | ICD-10-CM | POA: Insufficient documentation

## 2017-05-20 DIAGNOSIS — R55 Syncope and collapse: Secondary | ICD-10-CM | POA: Diagnosis not present

## 2017-05-20 DIAGNOSIS — J329 Chronic sinusitis, unspecified: Secondary | ICD-10-CM

## 2017-05-20 DIAGNOSIS — J019 Acute sinusitis, unspecified: Secondary | ICD-10-CM | POA: Insufficient documentation

## 2017-05-20 DIAGNOSIS — R05 Cough: Secondary | ICD-10-CM | POA: Diagnosis not present

## 2017-05-20 DIAGNOSIS — Z79899 Other long term (current) drug therapy: Secondary | ICD-10-CM | POA: Diagnosis not present

## 2017-05-20 LAB — BASIC METABOLIC PANEL
Anion gap: 11 (ref 5–15)
BUN: 12 mg/dL (ref 6–20)
CHLORIDE: 105 mmol/L (ref 101–111)
CO2: 23 mmol/L (ref 22–32)
CREATININE: 0.86 mg/dL (ref 0.61–1.24)
Calcium: 9.2 mg/dL (ref 8.9–10.3)
GFR calc non Af Amer: >60 mL/min (ref >60)
Glucose, Bld: 183 mg/dL — ABNORMAL HIGH (ref 65–99)
Potassium: 3.9 mmol/L (ref 3.5–5.1)
SODIUM: 139 mmol/L (ref 135–145)

## 2017-05-20 LAB — CBC WITH DIFFERENTIAL/PLATELET
BASOS ABS: 0 10*3/uL (ref 0.0–0.1)
BASOS PCT: 0 %
Eosinophils Absolute: 0 10*3/uL (ref 0.0–0.7)
Eosinophils Relative: 1 %
HCT: 43.7 % (ref 39.0–52.0)
HEMOGLOBIN: 14.8 g/dL (ref 13.0–17.0)
Lymphocytes Relative: 19 %
Lymphs Abs: 1.4 10*3/uL (ref 0.7–4.0)
MCH: 32.5 pg (ref 26.0–34.0)
MCHC: 33.9 g/dL (ref 30.0–36.0)
MCV: 95.8 fL (ref 78.0–100.0)
MONOS PCT: 7 %
Monocytes Absolute: 0.5 10*3/uL (ref 0.1–1.0)
NEUTROS PCT: 73 %
Neutro Abs: 5.5 10*3/uL (ref 1.7–7.7)
Platelets: 187 10*3/uL (ref 150–400)
RBC: 4.56 MIL/uL (ref 4.22–5.81)
RDW: 12.8 % (ref 11.5–15.5)
WBC: 7.4 10*3/uL (ref 4.0–10.5)

## 2017-05-20 MED ORDER — MECLIZINE HCL 25 MG PO TABS: 25.0000 mg | ORAL_TABLET | Freq: Three times a day (TID) | ORAL | 0 refills | Status: DC | PRN

## 2017-05-20 MED ORDER — MECLIZINE HCL 12.5 MG PO TABS
25.0000 mg | ORAL_TABLET | Freq: Once | ORAL | Status: AC
  Administered 2017-05-20: 25 mg via ORAL
  Filled 2017-05-20: qty 2

## 2017-05-20 MED ORDER — FLUTICASONE PROPIONATE 50 MCG/ACT NA SUSP: 1.0000 | Freq: Every day | NASAL | 2 refills | Status: DC

## 2017-05-20 NOTE — ED Provider Notes (Signed)
[facility], [place] EMERGENCY DEPARTMENT Provider Note   CSN: [id number] Arrival date & time: 05/20/17  1137     History   Chief Complaint Chief Complaint  Patient presents with  . Dizziness    HPI Scott Ballard is a 72 y.o. male.  HPI Patient presents to the emergency room for evaluation of dizziness.  Patient states for the last several days he has had trouble with sinus congestion and a dry cough.  He denies any fevers.    This morning however when he woke up he noticed that he started feeling dizzy, the room was spinning.  Patient noticed certain movements of his head would make it worse.  He walked and felt his head spinning again.  He felt like he had to hold onto the wall so decided come into the ED for evaluation.No chest pain.  No shortness of breath.  No complaints of vomiting or diarrhea.  No trouble with his vision or his coordination.  No numbness or weakness.  No headache. Past Medical History:  Diagnosis Date  . Diabetes mellitus without complication ([place])    Diet controlled versus prediabetic, no longer on medication  . Hypertension   . Smoker     Patient Active Problem List   Diagnosis Date Noted  . Heme positive stool 03/10/2016  . Abnormal LFTs 02/04/2014  . Special screening for malignant neoplasms, colon 02/04/2014  . Symptomatic cholelithiasis 09/23/2013  . Leukocytosis 09/23/2013  . Acute cholecystitis 09/23/2013  . Smoker 01/28/2013  . Diabetes mellitus without complication ([place])   . Hypertension     Past Surgical History:  Procedure Laterality Date  . CHOLECYSTECTOMY N/A 09/25/2013   Procedure: LAPAROSCOPIC CHOLECYSTECTOMY;  Surgeon: Jamesetta So, MD;  Location: [facility];  Service: General;  Laterality: N/A;  . KNEE ARTHROSCOPY    . MELANOMA EXCISION    . sinus          Home Medications    Prior to Admission medications   Medication Sig Start Date End Date Taking? Authorizing Provider  aspirin 81 MG tablet Take 81 mg by mouth daily.   Yes  [provider]  Blood Glucose Monitoring Suppl (ONE TOUCH ULTRA SYSTEM KIT) W/DEVICE KIT Needs strips #50/11 refills- , meter, lancets 100/11 refills DX E11.9 05/02/14  Yes Susy Frizzle, MD  CINNAMON PO Take 1 capsule by mouth every other day.   Yes [provider]  Cyanocobalamin (VITAMIN B 12 PO) Take 1 tablet by mouth daily.   Yes [provider]  lisinopril (PRINIVIL,ZESTRIL) 20 MG tablet Take 1 tablet (20 mg total) by mouth daily. 04/06/17  Yes Susy Frizzle, MD  ONE TOUCH ULTRA TEST test strip USE AS INSTRUCTED TO MONITOR FSBS 1X DAILY. DX: E11.9 12/14/15  Yes Susy Frizzle, MD  fluticasone (FLONASE) 50 MCG/ACT nasal spray Place 1 spray into both nostrils daily. 05/20/17   Dorie Rank, MD  meclizine (ANTIVERT) 25 MG tablet Take 1 tablet (25 mg total) by mouth 3 (three) times daily as needed for dizziness or nausea. 05/20/17   Dorie Rank, MD    Family History Family History  Problem Relation Age of Onset  . Colon cancer Neg Hx     Social History Social History   Tobacco Use  . Smoking status: Current Every Day Smoker    Types: Cigars  . Smokeless tobacco: Never Used  . Tobacco comment: chews on cigars  Substance Use Topics  . Alcohol use: No  . Drug use: No  Allergies   Patient has no known allergies.   Review of Systems Review of Systems  All other systems reviewed and are negative.    Physical Exam Updated Vital Signs BP 104/63   Pulse (!) 51   Temp 97.6 F (36.4 C) (Oral)   Resp 17   Ht 1.981 m ('6\' 6"' )   Wt 102.1 kg (225 lb)   SpO2 99%   BMI 26.00 kg/m   Physical Exam  Constitutional: He is oriented to person, place, and time. He appears well-developed and well-nourished. No distress.  HENT:  Head: Normocephalic and atraumatic.  Right Ear: External ear normal.  Left Ear: External ear normal.  Mouth/Throat: Oropharynx is clear and moist.  Eyes: Conjunctivae are normal. Right eye exhibits no discharge. Left eye  exhibits no discharge. No scleral icterus.  Neck: Neck supple. No tracheal deviation present.  Cardiovascular: Normal rate, regular rhythm and intact distal pulses.  Pulmonary/Chest: Effort normal and breath sounds normal. No stridor. No respiratory distress. He has no wheezes. He has no rales.  Abdominal: Soft. Bowel sounds are normal. He exhibits no distension. There is no tenderness. There is no rebound and no guarding.  Musculoskeletal: He exhibits no edema or tenderness.  Neurological: He is alert and oriented to person, place, and time. He has normal strength. No sensory deficit. Cranial nerve deficit: no gross deficits. He exhibits normal muscle tone. He displays no seizure activity. Coordination normal.  No pronator drift bilateral upper extrem, able to hold both legs off bed for 5 seconds, sensation intact in all extremities, no visual field cuts, no left or right sided neglect, normal finger-nose exam bilaterally, no nystagmus noted   Skin: Skin is warm and dry. No rash noted.  Psychiatric: He has a normal mood and affect.  Nursing note and vitals reviewed.    ED Treatments / Results  Labs (all labs ordered are listed, but only abnormal results are displayed) Labs Reviewed  BASIC METABOLIC PANEL - Abnormal; Notable for the following components:      Result Value   Glucose, Bld 183 (*)    All other components within normal limits  CBC WITH DIFFERENTIAL/PLATELET    EKG EKG Interpretation  Date/Time:  Saturday May 20 2017 11:56:38 EDT Ventricular Rate:  66 PR Interval:    QRS Duration: 117 QT Interval:  446 QTC Calculation: 468 R Axis:   -61 Text Interpretation:  Sinus rhythm Prolonged PR interval Left anterior fascicular block Nonspecific T abnormalities, lateral leads Baseline wander in lead(s) V4 No significant change since last tracing Confirmed by Dorie Rank [phone]) on 05/20/2017 12:06:55 PM   Radiology Dg Chest 2 View  Result Date: 05/20/2017 CLINICAL DATA:   Cough and sinus congestion EXAM: CHEST - 2 VIEW COMPARISON:  09/22/2013 FINDINGS: Low lung volumes with interstitial crowding. There is no edema, air bronchogram, effusion, or pneumothorax. Borderline heart size that is stable. Negative aortic and hilar contours. Artifact from EKG leads. IMPRESSION: Low volumes with interstitial crowding.  No definite pneumonia. Electronically Signed   By: Monte Fantasia M.D.   On: 05/20/2017 12:56    Procedures Procedures (including critical care time)  Medications Ordered in ED Medications  meclizine (ANTIVERT) tablet 25 mg (25 mg Oral Given 05/20/17 1258)     Initial Impression / Assessment and Plan / ED Course  I have reviewed the triage vital signs and the nursing notes.  Pertinent labs & imaging results that were available during my care of the patient were reviewed by me and  considered in my medical decision making (see chart for details).  Clinical Course as of May 20 1436  Sat May 20, 2017  1437 Laboratory test reviewed.  No significant abnormalities.   [GH]  8299 Chest x-ray without pneumonia.   [BZ]  1696 Patient was able to walk around the emergency room without any difficulty   [JK]    Clinical Course User Index [JK] Dorie Rank, MD  Patient presents with symptoms suggestive of peripheral vertigo.  This may be related to sinus and nasal congestion he has had recently.  Patient has no focal neurologic deficits.  Doubt stroke or TIA.  Patient appears stable for discharge with outpatient follow-up.  Warning signs and precautions discussed.  Final Clinical Impressions(s) / ED Diagnoses   Final diagnoses:  Vertigo  Sinusitis, unspecified chronicity, unspecified location    ED Discharge Orders        Ordered    fluticasone (FLONASE) 50 MCG/ACT nasal spray  Daily     05/20/17 1435    meclizine (ANTIVERT) 25 MG tablet  3 times daily PRN     05/20/17 1435       Dorie Rank, MD 05/20/17 1438

## 2017-05-20 NOTE — Discharge Instructions (Signed)
Continue your current medications, try taking the nasal steroids to help with your sinus inflammation and the meclizine to help with the dizziness, follow-up with your primary care doctor next week if your symptoms are not improving, return to the emergency room if you start having trouble with your vision, speech, or severe balance issues

## 2017-05-20 NOTE — ED Triage Notes (Signed)
Pt c/o sinus congestion and dry cough for the past few days.  Pt says he woke up this am and felt like room was spinning.

## 2017-05-20 NOTE — ED Notes (Signed)
Ambulated patient around nurses station. Pt's only complaint of dizziness was when going from sitting to standing. Gait steady and even. EDP notified.

## 2017-08-04 ENCOUNTER — Ambulatory Visit (INDEPENDENT_AMBULATORY_CARE_PROVIDER_SITE_OTHER): Payer: Medicare Other | Admitting: Family Medicine

## 2017-08-04 ENCOUNTER — Encounter: Payer: Self-pay | Admitting: Family Medicine

## 2017-08-04 VITALS — BP 122/66 | HR 70 | Temp 97.8°F | Resp 18 | Ht 75.0 in | Wt 236.0 lb

## 2017-08-04 DIAGNOSIS — R7989 Other specified abnormal findings of blood chemistry: Secondary | ICD-10-CM | POA: Diagnosis not present

## 2017-08-04 DIAGNOSIS — Z125 Encounter for screening for malignant neoplasm of prostate: Secondary | ICD-10-CM

## 2017-08-04 DIAGNOSIS — Z13220 Encounter for screening for lipoid disorders: Secondary | ICD-10-CM | POA: Diagnosis not present

## 2017-08-04 DIAGNOSIS — R7303 Prediabetes: Secondary | ICD-10-CM | POA: Diagnosis not present

## 2017-08-04 DIAGNOSIS — Z85820 Personal history of malignant melanoma of skin: Secondary | ICD-10-CM

## 2017-08-04 DIAGNOSIS — I10 Essential (primary) hypertension: Secondary | ICD-10-CM | POA: Diagnosis not present

## 2017-08-04 NOTE — Progress Notes (Signed)
Subjective:    Patient ID: Scott Ballard, male    DOB: 07-03-1945, 72 y.o.   MRN: [id number]  HPI Patient is a 72 year old Caucasian male here today for a checkup.  He has not been seen in more than a year.  Past medical history is significant for hypertension, diet-controlled diabetes mellitus, tobacco abuse.  He is overdue for a colonoscopy but he refuses a colonoscopy.  He is due for prostate cancer screening and will agrees to a PSA.  Past medical history is significant for melanoma x2.  He has not been seen by dermatology in quite some time.  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  I have recommended a dermatology consultation to address these issues.  He is not checking his blood sugar but he denies any polyuria, polydipsia, or blurry vision.  He denies any chest pain shortness of breath or dyspnea on exertion.  He denies checking his blood pressure at home however it is well controlled today at 122/66.  He is due for diabetic foot exam.  This was performed today in the office and was normal.  I have recommended an annual diabetic eye exam. Past Medical History:  Diagnosis Date  . Diabetes mellitus without complication ([place])    Diet controlled versus prediabetic, no longer on medication  . Hypertension   . Smoker    Past Surgical History:  Procedure Laterality Date  . CHOLECYSTECTOMY N/A 09/25/2013   Procedure: LAPAROSCOPIC CHOLECYSTECTOMY;  Surgeon: Jamesetta So, MD;  Location: [facility];  Service: General;  Laterality: N/A;  . KNEE ARTHROSCOPY    . MELANOMA EXCISION    . sinus     Current Outpatient Medications on File Prior to Visit  Medication Sig Dispense Refill  . aspirin 81 MG tablet Take 81 mg by mouth daily.    . Blood Glucose Monitoring Suppl (ONE TOUCH ULTRA SYSTEM KIT) W/DEVICE KIT Needs strips #50/11 refills- , meter, lancets 100/11 refills DX E11.9 1 each 0  . CINNAMON PO Take 1 capsule by mouth every other day.    .  Cyanocobalamin (VITAMIN B 12 PO) Take 1 tablet by mouth daily.    . fluticasone (FLONASE) 50 MCG/ACT nasal spray Place 1 spray into both nostrils daily. 16 g 2  . lisinopril (PRINIVIL,ZESTRIL) 20 MG tablet Take 1 tablet (20 mg total) by mouth daily. 90 tablet 2  . meclizine (ANTIVERT) 25 MG tablet Take 1 tablet (25 mg total) by mouth 3 (three) times daily as needed for dizziness or nausea. 21 tablet 0  . ONE TOUCH ULTRA TEST test strip USE AS INSTRUCTED TO MONITOR FSBS 1X DAILY. DX: E11.9 200 each 2   No current facility-administered medications on file prior to visit.    No Known Allergies Social History   Socioeconomic History  . Marital status: Married    Spouse name: Not on file  . Number of children: Not on file  . Years of education: Not on file  . Highest education level: Not on file  Occupational History  . Not on file  Social Needs  . Financial resource strain: Not on file  . Food insecurity:    Worry: Not on file    Inability: Not on file  . Transportation needs:    Medical: Not on file    Non-medical: Not on file  Tobacco Use  . Smoking status: Current Every Day Smoker    Types: Cigars  . Smokeless  tobacco: Never Used  . Tobacco comment: chews on cigars  Substance and Sexual Activity  . Alcohol use: No  . Drug use: No  . Sexual activity: Not on file  Lifestyle  . Physical activity:    Days per week: Not on file    Minutes per session: Not on file  . Stress: Not on file  Relationships  . Social connections:    Talks on phone: Not on file    Gets together: Not on file    Attends religious service: Not on file    Active member of club or organization: Not on file    Attends meetings of clubs or organizations: Not on file    Relationship status: Not on file  . Intimate partner violence:    Fear of current or ex partner: Not on file    Emotionally abused: Not on file    Physically abused: Not on file    Forced sexual activity: Not on file  Other Topics  Concern  . Not on file  Social History Narrative  . Not on file   Family History  Problem Relation Age of Onset  . Colon cancer Neg Hx       Review of Systems  All other systems reviewed and are negative.      Objective:   Physical Exam  Constitutional: He is oriented to person, place, and time. He appears well-developed and well-nourished. No distress.  HENT:  Head: Normocephalic and atraumatic.  Right Ear: External ear normal.  Left Ear: External ear normal.  Nose: Nose normal.  Mouth/Throat: Oropharynx is clear and moist. No oropharyngeal exudate.  Eyes: Pupils are equal, round, and reactive to light. Conjunctivae and EOM are normal. Right eye exhibits no discharge. Left eye exhibits no discharge.  Neck: Normal range of motion. No JVD present. No tracheal deviation present. No thyromegaly present.  Cardiovascular: Normal rate, regular rhythm, normal heart sounds and intact distal pulses. Exam reveals no gallop and no friction rub.  No murmur heard. Pulmonary/Chest: Effort normal and breath sounds normal. No stridor. No respiratory distress. He has no wheezes. He has no rales. He exhibits no tenderness.  Abdominal: Soft. Bowel sounds are normal. He exhibits no distension and no mass. There is no tenderness. There is no rebound and no guarding.  Musculoskeletal: Normal range of motion. He exhibits no edema, tenderness or deformity.  Lymphadenopathy:    He has no cervical adenopathy.  Neurological: He is alert and oriented to person, place, and time. He displays normal reflexes. No cranial nerve deficit. He exhibits normal muscle tone. Coordination normal.  Skin: Skin is warm. Rash noted. He is not diaphoretic.  Psychiatric: He has a normal mood and affect. His behavior is normal. Judgment and thought content normal.  Vitals reviewed.  See hpi- widespread ak's on face, cheeks, forehead       Assessment & Plan:  Prediabetes - Plan: CBC with Differential/Platelet, COMPLETE  METABOLIC PANEL WITH GFR, Lipid panel, Microalbumin, urine, Hemoglobin A1c  Prostate cancer screening  Essential hypertension  Patient's blood pressure is well controlled.  I will check a hemoglobin A1c to monitor the management of his diabetes.  I will check a urine microalbumin to monitor for any signs of diabetic nephropathy.  I have recommended an annual diabetic eye exam.  Diabetic foot exam is performed today and is normal.  I will check a fasting lipid panel to monitor his cholesterol.  I will check a PSA to screen for prostate cancer.  I will consult dermatology to address the significant sun damage on his face particular given his history of melanoma.  He would benefit from an annual skin check every year with dermatology.

## 2017-08-05 LAB — CBC WITH DIFFERENTIAL/PLATELET
BASOS PCT: 0.4 %
Basophils Absolute: 22 cells/uL (ref 0–200)
EOS PCT: 1.3 %
Eosinophils Absolute: 70 cells/uL (ref 15–500)
HEMATOCRIT: 44 % (ref 38.5–50.0)
Hemoglobin: 15 g/dL (ref 13.2–17.1)
LYMPHS ABS: 1399 {cells}/uL (ref 850–3900)
MCH: 32.3 pg (ref 27.0–33.0)
MCHC: 34.1 g/dL (ref 32.0–36.0)
MCV: 94.8 fL (ref 80.0–100.0)
MPV: 10.8 fL (ref 7.5–12.5)
Monocytes Relative: 7.9 %
NEUTROS PCT: 64.5 %
Neutro Abs: 3483 cells/uL (ref 1500–7800)
Platelets: 190 10*3/uL (ref 140–400)
RBC: 4.64 10*6/uL (ref 4.20–5.80)
RDW: 13 % (ref 11.0–15.0)
Total Lymphocyte: 25.9 %
WBC mixed population: 427 cells/uL (ref 200–950)
WBC: 5.4 10*3/uL (ref 3.8–10.8)

## 2017-08-05 LAB — COMPLETE METABOLIC PANEL WITH GFR
AG RATIO: 1.8 (calc) (ref 1.0–2.5)
ALKALINE PHOSPHATASE (APISO): 47 U/L (ref 40–115)
ALT: 16 U/L (ref 9–46)
AST: 17 U/L (ref 10–35)
Albumin: 4.2 g/dL (ref 3.6–5.1)
BILIRUBIN TOTAL: 0.8 mg/dL (ref 0.2–1.2)
BUN: 14 mg/dL (ref 7–25)
CO2: 24 mmol/L (ref 20–32)
Calcium: 9 mg/dL (ref 8.6–10.3)
Chloride: 106 mmol/L (ref 98–110)
Creat: 0.95 mg/dL (ref 0.70–1.18)
GFR, Est African American: 91 mL/min/{1.73_m2} (ref >=60)
GFR, Est Non African American: 79 mL/min/{1.73_m2} (ref >=60)
GLOBULIN: 2.4 g/dL (ref 1.9–3.7)
Glucose, Bld: 189 mg/dL — ABNORMAL HIGH (ref 65–99)
POTASSIUM: 4.2 mmol/L (ref 3.5–5.3)
SODIUM: 139 mmol/L (ref 135–146)
Total Protein: 6.6 g/dL (ref 6.1–8.1)

## 2017-08-05 LAB — LIPID PANEL
CHOLESTEROL: 165 mg/dL (ref <200)
HDL: 43 mg/dL (ref >40)
LDL Cholesterol (Calc): 91 mg/dL (calc)
Non-HDL Cholesterol (Calc): 122 mg/dL (calc) (ref <130)
Total CHOL/HDL Ratio: 3.8 (calc) (ref <5.0)
Triglycerides: 222 mg/dL — ABNORMAL HIGH (ref <150)

## 2017-08-05 LAB — MICROALBUMIN, URINE: Microalb, Ur: <0.2 mg/dL

## 2017-08-05 LAB — HEMOGLOBIN A1C
HEMOGLOBIN A1C: 5.8 %{Hb} — AB (ref <5.7)
MEAN PLASMA GLUCOSE: 120 (calc)
eAG (mmol/L): 6.6 (calc)

## 2017-09-05 ENCOUNTER — Encounter (INDEPENDENT_AMBULATORY_CARE_PROVIDER_SITE_OTHER): Payer: Self-pay

## 2018-05-18 ENCOUNTER — Other Ambulatory Visit: Payer: Self-pay | Admitting: *Deleted

## 2018-05-18 MED ORDER — LISINOPRIL 20 MG PO TABS: 20.0000 mg | ORAL_TABLET | Freq: Every day | ORAL | 2 refills | Status: DC

## 2018-06-15 ENCOUNTER — Other Ambulatory Visit: Payer: Self-pay | Admitting: *Deleted

## 2018-07-13 ENCOUNTER — Other Ambulatory Visit: Payer: Self-pay | Admitting: *Deleted

## 2018-07-13 NOTE — Patient Outreach (Signed)
[facility]) Care Management  07/13/2018  Scott Ballard 1945-01-28 [id number]   Case Closure/Transition to [facility]   Outreach:  Patient case has been transitioned to [facility] for further Care Management assistance.  Plan: RN Health Coach will close case at this time. RN Health Coach will send primary care provider Care Management Case Closure Letter. RN Health Coach will make patient inactive with [facility] Care Management at this time.  [place] [phone] [email or web address]

## 2018-10-05 IMAGING — DX DG CHEST 2V
2 series · 2 of 2 positions shown · non-contrast
Comparison: 09/22/2013

CLINICAL DATA: Cough and sinus congestion

EXAM:
CHEST - 2 VIEW

[chest lat]
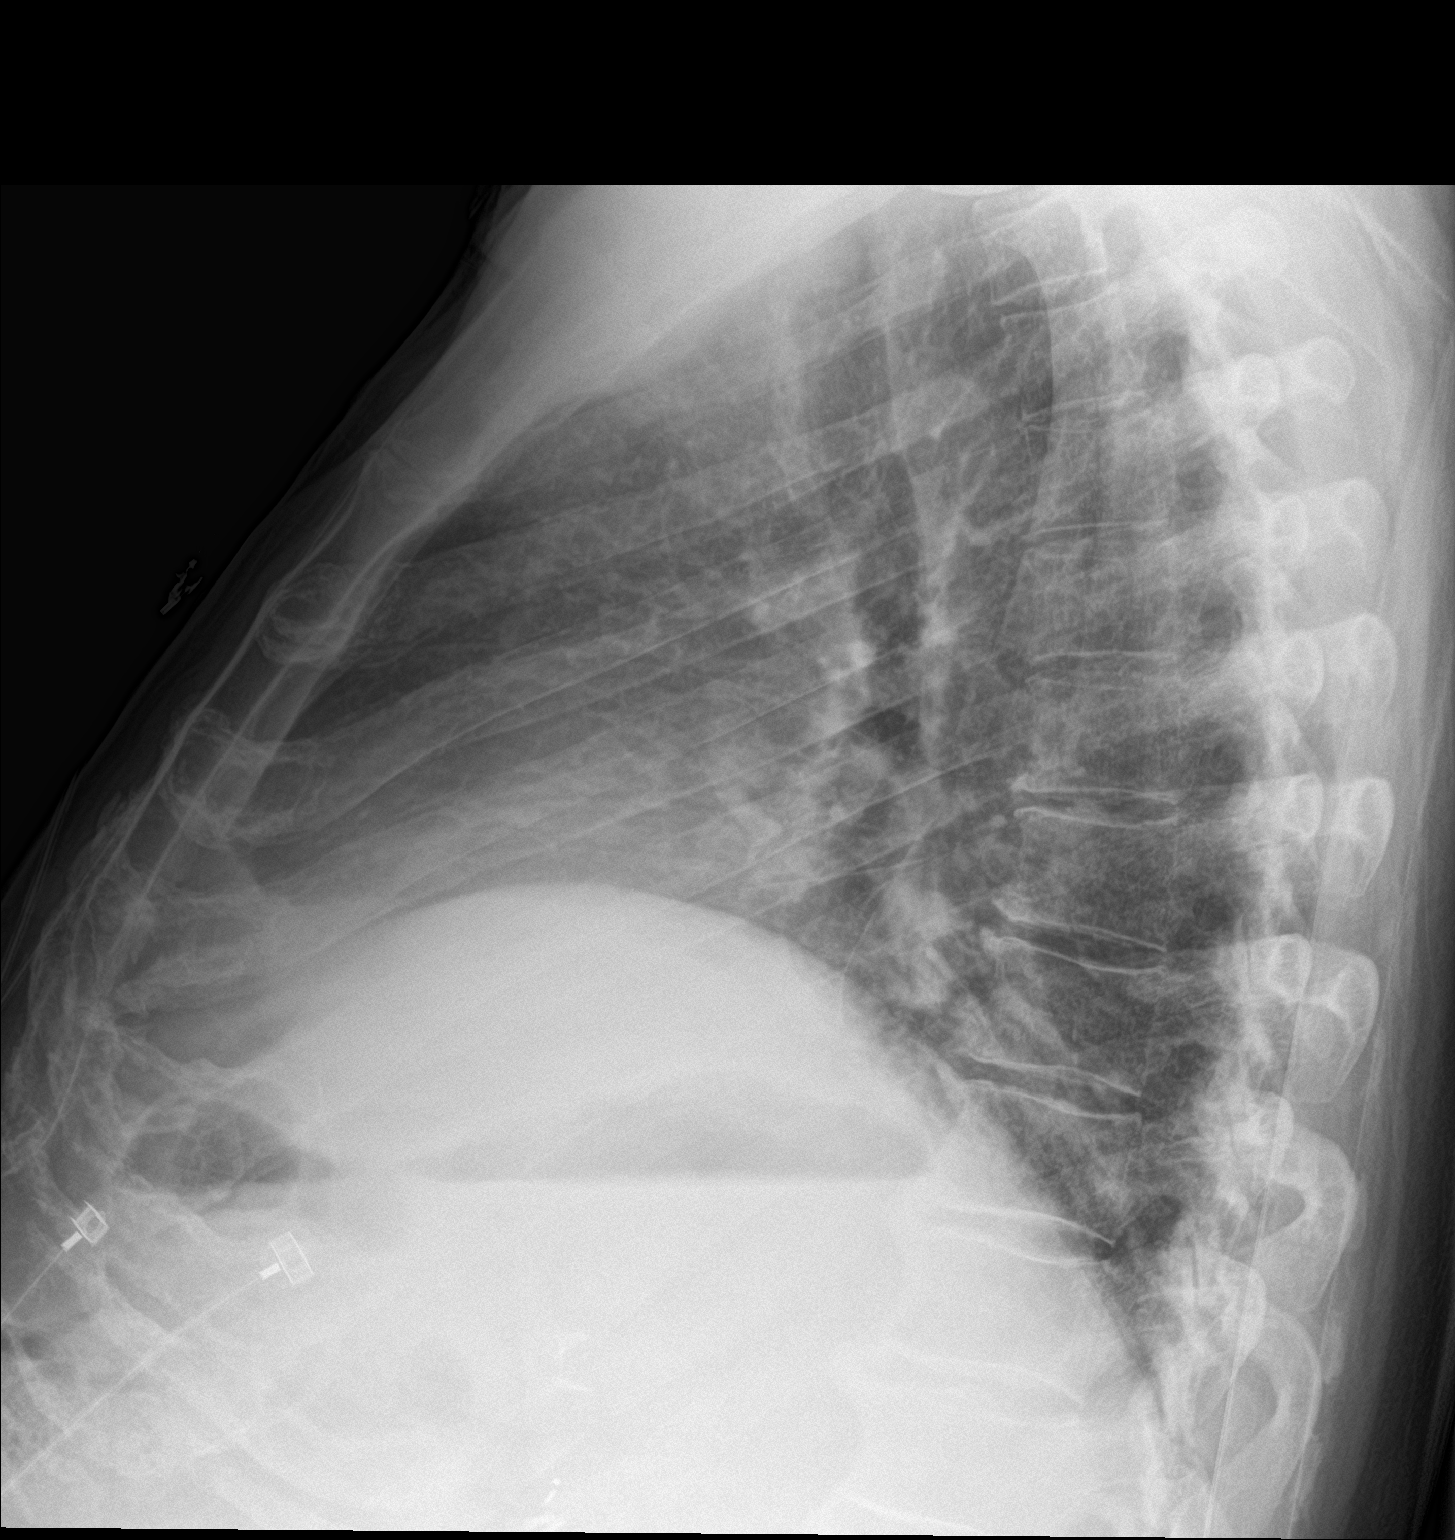

[chest ap]
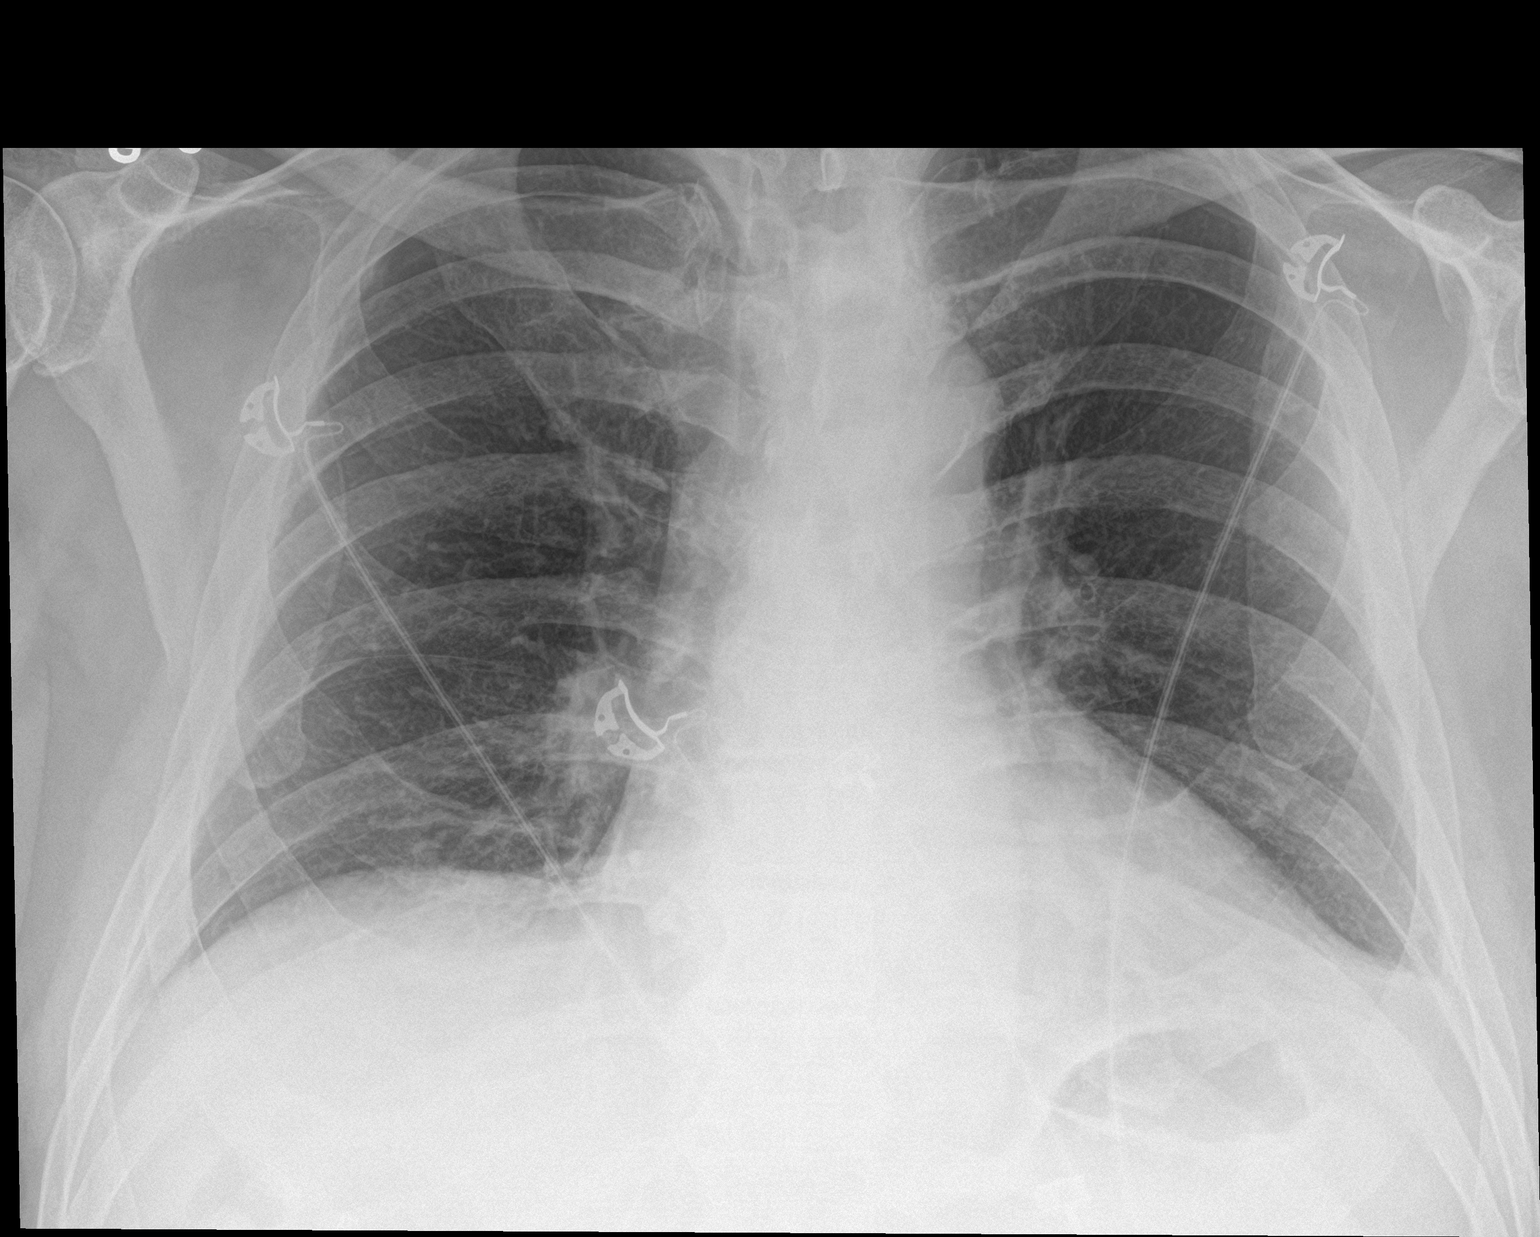

[2 of 2 positions shown; findings below may reference images not displayed]

FINDINGS: Low lung volumes with interstitial crowding. There is no edema, air
bronchogram, effusion, or pneumothorax. Borderline heart size that
is stable. Negative aortic and hilar contours.

Artifact from EKG leads.
IMPRESSION: Low volumes with interstitial crowding.  No definite pneumonia.

## 2019-02-07 ENCOUNTER — Other Ambulatory Visit: Payer: Self-pay | Admitting: Family Medicine

## 2019-02-09 ENCOUNTER — Other Ambulatory Visit: Payer: Self-pay | Admitting: Family Medicine

## 2019-11-06 ENCOUNTER — Other Ambulatory Visit: Payer: Self-pay | Admitting: Family Medicine

## 2019-11-13 ENCOUNTER — Other Ambulatory Visit: Payer: Self-pay | Admitting: Family Medicine

## 2019-11-14 ENCOUNTER — Other Ambulatory Visit: Payer: Self-pay | Admitting: Family Medicine

## 2020-06-25 ENCOUNTER — Encounter: Payer: Self-pay | Admitting: Family Medicine

## 2020-06-25 DIAGNOSIS — C439 Malignant melanoma of skin, unspecified: Secondary | ICD-10-CM | POA: Insufficient documentation

## 2020-08-11 ENCOUNTER — Other Ambulatory Visit: Payer: Self-pay | Admitting: Family Medicine

## 2020-08-20 ENCOUNTER — Encounter: Payer: Self-pay | Admitting: Family Medicine

## 2020-08-20 ENCOUNTER — Ambulatory Visit (INDEPENDENT_AMBULATORY_CARE_PROVIDER_SITE_OTHER): Payer: Medicare Other | Admitting: Family Medicine

## 2020-08-20 ENCOUNTER — Other Ambulatory Visit: Payer: Self-pay

## 2020-08-20 VITALS — BP 126/88 | HR 82 | Temp 97.9°F | Resp 14 | Ht 75.0 in | Wt 232.0 lb

## 2020-08-20 DIAGNOSIS — E119 Type 2 diabetes mellitus without complications: Secondary | ICD-10-CM

## 2020-08-20 DIAGNOSIS — R5382 Chronic fatigue, unspecified: Secondary | ICD-10-CM

## 2020-08-20 NOTE — Progress Notes (Signed)
Subjective:    Patient ID: Scott Carrow., male    DOB: Jul 10, 1945, 75 y.o.   MRN: [id number]  Patient is a very pleasant 75 year old Caucasian gentleman who I have not seen since 2019.  He has a past medical history of melanoma as well as hypertension and prediabetes.  He primarily came today for a checkup.  However he states that he is tired all the time.  He states that he does not sleep well.  However it is all that he can do to get out of bed in the morning.  After the first hour, he "wakes up and then he has energy the rest of the day".  However he states that he feels like he has not rested when he first gets out of bed in the morning.  He feels tired.  He reports fatigue.  He denies any chest pain shortness of breath dyspnea on exertion.  He denies any melena or hematochezia.  He denies any cough or wheezing or hemoptysis.  He denies any apneic episodes or snoring.  He denies that his wife hears him stop breathing at night.  He does have hypersomnolence first thing in the morning. Past Medical History:  Diagnosis Date   Diabetes mellitus without complication ([place])    Diet controlled versus prediabetic, no longer on medication   Hypertension    Melanoma ([place])    left neck, excision at [facility] (05/2019) free margns and lymph node negative   Smoker    Past Surgical History:  Procedure Laterality Date   CHOLECYSTECTOMY N/A 09/25/2013   Procedure: LAPAROSCOPIC CHOLECYSTECTOMY;  Surgeon: Jamesetta So, MD;  Location: [facility];  Service: General;  Laterality: N/A;   KNEE ARTHROSCOPY     MELANOMA EXCISION     sinus     Current Outpatient Medications on File Prior to Visit  Medication Sig Dispense Refill   aspirin 81 MG tablet Take 81 mg by mouth daily.     cetirizine (ZYRTEC) 10 MG tablet Take 10 mg by mouth daily.     cholecalciferol (VITAMIN D3) 25 MCG (1000 UNIT) tablet Take 1,000 Units by mouth daily.     CINNAMON PO Take 1 capsule by mouth every other day.     Cyanocobalamin (VITAMIN B  12 PO) Take 1 tablet by mouth daily.     lisinopril (ZESTRIL) 20 MG tablet TAKE 1 TABLET BY MOUTH EVERY DAY 90 tablet 2   No current facility-administered medications on file prior to visit.   No Known Allergies Social History   Socioeconomic History   Marital status: Married    Spouse name: Not on file   Number of children: Not on file   Years of education: Not on file   Highest education level: Not on file  Occupational History   Not on file  Tobacco Use   Smoking status: Some Days    Types: Cigars   Smokeless tobacco: Never   Tobacco comments:    chews on cigars  Substance and Sexual Activity   Alcohol use: No   Drug use: No   Sexual activity: Yes  Other Topics Concern   Not on file  Social History Narrative   Not on file   Social Determinants of Health   Financial Resource Strain: Not on file  Food Insecurity: Not on file  Transportation Needs: Not on file  Physical Activity: Not on file  Stress: Not on file  Social Connections: Not on file  Intimate Partner Violence: Not on  file   Family History  Problem Relation Age of Onset   Colon cancer Neg Hx       Review of Systems  All other systems reviewed and are negative.     Objective:   Physical Exam Vitals reviewed.  Constitutional:      General: He is not in acute distress.    Appearance: He is well-developed. He is not diaphoretic.  HENT:     Head: Normocephalic and atraumatic.     Right Ear: External ear normal.     Left Ear: External ear normal.     Nose: Nose normal.     Mouth/Throat:     Pharynx: No oropharyngeal exudate.  Eyes:     General:        Right eye: No discharge.        Left eye: No discharge.     Conjunctiva/sclera: Conjunctivae normal.     Pupils: Pupils are equal, round, and reactive to light.  Neck:     Thyroid: No thyromegaly.     Vascular: No JVD.     Trachea: No tracheal deviation.  Cardiovascular:     Rate and Rhythm: Normal rate and regular rhythm.     Heart  sounds: Normal heart sounds. No murmur heard.   No friction rub. No gallop.  Pulmonary:     Effort: Pulmonary effort is normal. No respiratory distress.     Breath sounds: Normal breath sounds. No stridor. No wheezing or rales.  Chest:     Chest wall: No tenderness.  Abdominal:     General: Bowel sounds are normal. There is no distension.     Palpations: Abdomen is soft. There is no mass.     Tenderness: There is no abdominal tenderness. There is no guarding or rebound.  Musculoskeletal:        General: No tenderness or deformity. Normal range of motion.     Cervical back: Normal range of motion.  Lymphadenopathy:     Cervical: No cervical adenopathy.  Skin:    General: Skin is warm.     Findings: Rash present.  Neurological:     Mental Status: He is alert and oriented to person, place, and time.     Cranial Nerves: No cranial nerve deficit.     Motor: No abnormal muscle tone.     Coordination: Coordination normal.     Deep Tendon Reflexes: Reflexes normal.  Psychiatric:        Behavior: Behavior normal.        Thought Content: Thought content normal.        Judgment: Judgment normal.   widespread ak's on face, cheeks, forehead       Assessment & Plan:  Diabetes mellitus without complication ([place]) - Plan: CBC with Differential/Platelet, COMPLETE METABOLIC PANEL WITH GFR, TSH, Hemoglobin A1c, Testosterone Total,Free,Bio, Males  Chronic fatigue - Plan: CBC with Differential/Platelet, COMPLETE METABOLIC PANEL WITH GFR, TSH, Hemoglobin A1c, Testosterone Total,Free,Bio, Males  Blood pressure is excellent.  Begin by obtaining baseline lab work including a CBC, CMP, A1c, TSH, testosterone level.  If lab work is normal, I would consider a sleep study as apnea can certainly cause his symptoms.

## 2020-08-21 LAB — CBC WITH DIFFERENTIAL/PLATELET
Absolute Monocytes: 572 cells/uL (ref 200–950)
Basophils Absolute: 28 cells/uL (ref 0–200)
Basophils Relative: 0.5 %
Eosinophils Absolute: 88 cells/uL (ref 15–500)
Eosinophils Relative: 1.6 %
HCT: 44.4 % (ref 38.5–50.0)
Hemoglobin: 15.3 g/dL (ref 13.2–17.1)
Lymphs Abs: 1788 cells/uL (ref 850–3900)
MCH: 33.8 pg — ABNORMAL HIGH (ref 27.0–33.0)
MCHC: 34.5 g/dL (ref 32.0–36.0)
MCV: 98 fL (ref 80.0–100.0)
MPV: 10.7 fL (ref 7.5–12.5)
Monocytes Relative: 10.4 %
Neutro Abs: 3025 cells/uL (ref 1500–7800)
Neutrophils Relative %: 55 %
Platelets: 210 10*3/uL (ref 140–400)
RBC: 4.53 10*6/uL (ref 4.20–5.80)
RDW: 12.8 % (ref 11.0–15.0)
Total Lymphocyte: 32.5 %
WBC: 5.5 10*3/uL (ref 3.8–10.8)

## 2020-08-21 LAB — HEMOGLOBIN A1C
Hgb A1c MFr Bld: 5.9 % of total Hgb — ABNORMAL HIGH (ref <5.7)
Mean Plasma Glucose: 123 mg/dL
eAG (mmol/L): 6.8 mmol/L

## 2020-08-21 LAB — COMPLETE METABOLIC PANEL WITH GFR
AG Ratio: 1.7 (calc) (ref 1.0–2.5)
ALT: 16 U/L (ref 9–46)
AST: 17 U/L (ref 10–35)
Albumin: 4.3 g/dL (ref 3.6–5.1)
Alkaline phosphatase (APISO): 51 U/L (ref 35–144)
BUN: 13 mg/dL (ref 7–25)
CO2: 25 mmol/L (ref 20–32)
Calcium: 9.3 mg/dL (ref 8.6–10.3)
Chloride: 107 mmol/L (ref 98–110)
Creat: 0.81 mg/dL (ref 0.70–1.28)
Globulin: 2.5 g/dL (calc) (ref 1.9–3.7)
Glucose, Bld: 119 mg/dL — ABNORMAL HIGH (ref 65–99)
Potassium: 4.8 mmol/L (ref 3.5–5.3)
Sodium: 140 mmol/L (ref 135–146)
Total Bilirubin: 1.1 mg/dL (ref 0.2–1.2)
Total Protein: 6.8 g/dL (ref 6.1–8.1)
eGFR: 91 mL/min/{1.73_m2} (ref >=60)

## 2020-08-21 LAB — TESTOSTERONE TOTAL,FREE,BIO, MALES
Albumin: 4.3 g/dL (ref 3.6–5.1)
Sex Hormone Binding: 45 nmol/L (ref 22–77)
Testosterone, Bioavailable: 131.6 ng/dL (ref 15.0–150.0)
Testosterone, Free: 66.8 pg/mL (ref 6.0–73.0)
Testosterone: 620 ng/dL (ref 250–827)

## 2020-08-21 LAB — TSH: TSH: 1.23 mIU/L (ref 0.40–4.50)

## 2020-08-28 ENCOUNTER — Encounter: Payer: Self-pay | Admitting: *Deleted

## 2021-05-07 ENCOUNTER — Other Ambulatory Visit: Payer: Self-pay | Admitting: Family Medicine

## 2021-12-08 ENCOUNTER — Telehealth: Payer: Self-pay | Admitting: Family Medicine

## 2021-12-08 NOTE — Telephone Encounter (Signed)
Left message for patient to call back and schedule Medicare Annual Wellness Visit (AWV) either virtually or in office. Left  my Scott Ballard number [phone]   Last AWV 02/01/16 please schedule with Nurse Health Adviser   45 min for awv-i and in office appointments 30 min for awv-s  phone/virtual appointments

## 2022-01-27 ENCOUNTER — Other Ambulatory Visit: Payer: Self-pay | Admitting: Family Medicine

## 2022-02-08 ENCOUNTER — Ambulatory Visit (INDEPENDENT_AMBULATORY_CARE_PROVIDER_SITE_OTHER): Payer: Medicare Other

## 2022-02-08 VITALS — BP 120/68 | Ht 75.0 in | Wt 229.0 lb

## 2022-02-08 DIAGNOSIS — Z0000 Encounter for general adult medical examination without abnormal findings: Secondary | ICD-10-CM

## 2022-02-08 NOTE — Progress Notes (Signed)
Subjective:   Scott Ballard. is a 77 y.o. male who presents for Medicare Annual/Subsequent preventive examination.  Review of Systems     Cardiac Risk Factors include: diabetes mellitus;advanced age (>73mn, >>14women);hypertension;male gender     Objective:    Today's Vitals   02/08/22 1432  BP: 120/68  Weight: 229 lb (103.9 kg)  Height: '6\' 3"'$  (1.905 m)   Body mass index is 28.62 kg/m.     02/08/2022    2:56 PM 05/20/2017   11:45 AM 09/25/2013    9:24 AM 09/23/2013    3:00 PM  Advanced Directives  Does Patient Have a Medical Advance Directive? No Yes No No  Type of ACorporate treasure[place]f [place] will    Would patient like information on creating a medical advance directive? Yes (MAU/Ambulatory/Procedural Areas - Information given)  No - patient declined information     Current Medications (verified) Outpatient Encounter Medications as of 02/08/2022  Medication Sig   aspirin 81 MG tablet Take 81 mg by mouth daily.   cetirizine (ZYRTEC) 10 MG tablet Take 10 mg by mouth daily.   cholecalciferol (VITAMIN D3) 25 MCG (1000 UNIT) tablet Take 1,000 Units by mouth daily.   CINNAMON PO Take 1 capsule by mouth every other day.   Cyanocobalamin (VITAMIN B 12 PO) Take 1 tablet by mouth daily.   lisinopril (ZESTRIL) 20 MG tablet TAKE 1 TABLET BY MOUTH EVERY DAY   No facility-administered encounter medications on file as of 02/08/2022.    Allergies (verified) Patient has no known allergies.   History: Past Medical History:  Diagnosis Date   Diabetes mellitus without complication ([facility]    Diet controlled versus prediabetic, no longer on medication   Hypertension    Melanoma ([place]    [facility] free margns and lymph node negative   Smoker    Past Surgical History:  Procedure Laterality Date   CHOLECYSTECTOMY N/A 09/25/2013   Procedure: LAPAROSCOPIC CHOLECYSTECTOMY;  Surgeon: MJamesetta So MD;  Location: [facility];  Service:  General;  Laterality: N/A;   KNEE ARTHROSCOPY     MELANOMA EXCISION     sinus     Family History  Problem Relation Age of Onset   Colon cancer Neg Hx    Social History   Socioeconomic History   Marital status: Married    Spouse name: Not on file   Number of children: Not on file   Years of education: Not on file   Highest education level: Not on file  Occupational History   Not on file  Tobacco Use   Smoking status: Some Days    Types: Cigars   Smokeless tobacco: Never   Tobacco comments:    chews on cigars  Substance and Sexual Activity   Alcohol use: No   Drug use: No   Sexual activity: Yes  Other Topics Concern   Not on file  Social History Narrative   Not on file   Social Determinants of Health   Financial Resource Strain: Low Risk  (02/08/2022)   Overall Financial Resource Strain (CARDIA)    Difficulty of Paying Living Expenses: Not hard at all  Food Insecurity: No Food Insecurity (02/08/2022)   Hunger Vital Sign    Worried About Running Out of Food in the Last Year: Never true    Ran Out of Food in the Last Year: Never true  Transportation Needs: No Transportation Needs (02/08/2022)   [place]  H[place]rologist (Medical): No    Lack of Transportation (Non-Medical): No  Physical Activity: Inactive (02/08/2022)   Exercise Vital Sign    Days of Exercise per Week: 0 days    Minutes of Exercise per Session: 0 min  Stress: No Stress Concern Present (02/08/2022)   [place]    Feeling of Stress : Not at all  Social Connections: Moderately Isolated (02/08/2022)   Social Connection and Isolation Panel [NHANES]    Frequency of Communication with Friends and Family: More than three times a week    Frequency of Social Gatherings with Friends and Family: Three times a week    Attends Religious Services: Never    Active Member of Clubs or Organizations: No    Attends Programme researcher, broadcasting/film/video: Never    Marital Status: Married    Tobacco Counseling Ready to quit: Not Answered Counseling given: Not Answered Tobacco comments: chews on cigars   Clinical Intake:  Pre-visit preparation completed: Yes  Pain : No/denies pain     Diabetes: Yes CBG done?: No Did pt. bring in CBG monitor from home?: No  How often do you need to have someone help you when you read instructions, pamphlets, or other written materials from your doctor or pharmacy?: 1 - Never  Diabetic?Yes Nutrition Risk Assessment:  Has the patient had any N/V/D within the last 2 months?  No  Does the patient have any non-healing wounds?  No  Has the patient had any unintentional weight loss or weight gain?  No   Diabetes:  Is the patient diabetic?  Yes  If diabetic, was a CBG obtained today?  No  Did the patient bring in their glucometer from home?  No  How often do you monitor your CBG's? Doesn't monitor; diet controlled .   Financial Strains and Diabetes Management:  Are you having any financial strains with the device, your supplies or your medication? No .  Does the patient want to be seen by Chronic Care Management for management of their diabetes?  No  Would the patient like to be referred to a Nutritionist or for Diabetic Management?  No   Diabetic Exams:  Diabetic Eye Exam: Overdue for diabetic eye exam. Pt has been advised about the importance in completing this exam. Patient advised to call and schedule an eye exam. Diabetic Foot Exam: Overdue, Pt has been advised about the importance in completing this exam. Pt is scheduled for diabetic foot exam on next appointment date.   Interpreter Needed?: No  Information entered by :: Denman George LPN   Activities of Daily Living    02/08/2022    2:56 PM  In your present state of health, do you have any difficulty performing the following activities:  Hearing? 0  Vision? 0  Difficulty concentrating or making  decisions? 0  Walking or climbing stairs? 0  Dressing or bathing? 0  Doing errands, shopping? 0  Preparing Food and eating ? N  Using the Toilet? N  In the past six months, have you accidently leaked urine? N  Do you have problems with loss of bowel control? N  Managing your Medications? N  Managing your Finances? N  Housekeeping or managing your Housekeeping? N    Patient Care Team: Susy Frizzle, MD as PCP - General (Family Medicine) Danie Binder, MD (Inactive) as Consulting Physician (Gastroenterology) Mora Appl, NP as Nurse Practitioner (Nurse Practitioner)  [facility]  any recent Medical Services you may have received from other than [facility] providers in the past year (date may be approximate).     Assessment:   This is a routine wellness examination for [place].  Hearing/Vision screen Hearing Screening - Comments:: Denies hearing difficulties  Vision Screening - Comments:: Patient plans to schedule appointment this year for diabetic eye exam     Dietary issues and exercise activities discussed: Current Exercise Habits: The patient does not participate in regular exercise at present   Goals Addressed             This Visit's Progress    DIET - INCREASE WATER INTAKE         Depression Screen    02/08/2022    2:53 PM 08/20/2020   11:20 AM 08/04/2017    3:11 PM 08/12/2013    3:39 PM  PHQ 2/9 Scores  PHQ - 2 Score 0 0 0 1    Fall Risk    02/08/2022    2:52 PM 08/20/2020   11:20 AM 08/04/2017    3:11 PM 12/29/2016    2:43 PM 09/18/2015    5:32 PM  Fall Risk   Falls in the past year? 0 0 No No No  Comment    [employer] Tele[employer] Survey: data to providers prior to load [employer] Survey: data to providers prior to load  Number falls in past yr: 0 0     Injury with Fall? 0 0     Risk for fall due to : No Fall Risks No Fall Risks     Follow up Falls evaluation completed;Education provided;Falls prevention discussed Falls evaluation completed       FALL RISK  PREVENTION PERTAINING TO THE HOME:  Any stairs in or around the home? No  If so, are there any without handrails? No  Home free of loose throw rugs in walkways, pet beds, electrical cords, etc? Yes  Adequate lighting in your home to reduce risk of falls? Yes   ASSISTIVE DEVICES UTILIZED TO PREVENT FALLS:  Life alert? No  Use of a cane, walker or w/c? No  Grab bars in the bathroom? Yes  Shower chair or bench in shower? No  Elevated toilet seat or a handicapped toilet? Yes   TIMED UP AND GO:  Was the test performed? Yes .  Length of time to ambulate 10 feet: 6 sec.   Gait slow and steady without use of assistive device  Cognitive Function:        02/08/2022    2:56 PM  6CIT Screen  What Year? 0 points  What month? 0 points  What time? 0 points  Count back from 20 0 points  Months in reverse 0 points  Repeat phrase 0 points  Total Score 0 points    Immunizations Immunization History  Administered Date(s) Administered   Influenza,inj,Quad PF,6+ Mos 02/01/2016   Pneumococcal Conjugate-13 08/12/2013   Pneumococcal Polysaccharide-23 06/06/2007, 02/05/2016   Tdap 08/12/2013    TDAP status: Up to date  Flu Vaccine status: Declined, Education has been provided regarding the importance of this vaccine but patient still declined. Advised may receive this vaccine at local pharmacy or Health Dept. Aware to provide a copy of the vaccination record if obtained from local pharmacy or Health Dept. Verbalized acceptance and understanding.  Pneumococcal vaccine status: Up to date  Covid-19 vaccine status: Information provided on how to obtain vaccines.   Qualifies for Shingles Vaccine? Yes   Zostavax completed No  Shingrix Completed?: No.    Education has been provided regarding the importance of this vaccine. Patient has been advised to call insurance company to determine out of pocket expense if they have not yet received this vaccine. Advised may also receive vaccine at local  pharmacy or Health Dept. Verbalized acceptance and understanding.  Screening Tests Health Maintenance  Topic Date Due   Diabetic kidney evaluation - Urine ACR  Never done   Hepatitis C Screening  Never done   Diabetic kidney evaluation - eGFR measurement  08/20/2021   INFLUENZA VACCINE  04/24/2022 (Originally 08/24/2021)   Zoster Vaccines- Shingrix (1 of 2) 05/10/2022 (Originally 01/31/1964)   Medicare Annual Wellness (AWV)  02/09/2023   DTaP/Tdap/Td (2 - Td or Tdap) 08/13/2023   Pneumonia Vaccine 78+ Years old  Completed   HPV VACCINES  Aged Out   COVID-19 Vaccine  Discontinued    Health Maintenance  Health Maintenance Due  Topic Date Due   Diabetic kidney evaluation - Urine ACR  Never done   Hepatitis C Screening  Never done   Diabetic kidney evaluation - eGFR measurement  08/20/2021    Colorectal cancer screening: No longer required.   Lung Cancer Screening: (Low Dose CT Chest recommended if Age 13-80 years, 30 pack-year currently smoking OR have quit w/in 15years.) does not qualify.   Lung Cancer Screening Referral: n/a   Additional Screening:  Hepatitis C Screening: does qualify; Completed at next office visit   Vision Screening: Recommended annual ophthalmology exams for early detection of glaucoma and other disorders of the eye. Is the patient up to date with their annual eye exam?  No  Who is the provider or what is the name of the office in which the patient attends annual eye exams? Plans to establish this year  If pt is not established with a provider, would they like to be referred to a provider to establish care? No .   Dental Screening: Recommended annual dental exams for proper oral hygiene  Community Resource Referral / Chronic Care Management: CRR required this visit?  No   CCM required this visit?  No      Plan:     I have personally reviewed and noted the following in the patient's chart:   Medical and social history Use of alcohol, tobacco or  illicit drugs  Current medications and supplements including opioid prescriptions. Patient is not currently taking opioid prescriptions. Functional ability and status Nutritional status Physical activity Advanced directives List of other physicians Hospitalizations, surgeries, and ER visits in previous 12 months Vitals Screenings to include cognitive, depression, and falls Referrals and appointments  In addition, I have reviewed and discussed with patient certain preventive protocols, quality metrics, and best practice recommendations. A written personalized care plan for preventive services as well as general preventive health recommendations were provided to patient.     Denman George [place], [place]   9/32/6712   Nurse Notes: No concerns; scheduled for a routine follow up visit

## 2022-02-08 NOTE — Patient Instructions (Addendum)
Scott Ballard , Thank you for taking time to come for your Medicare Wellness Visit. I appreciate your ongoing commitment to your health goals. Please review the following plan we discussed and let me know if I can assist you in the future.   These are the goals we discussed:  Goals      DIET - INCREASE WATER INTAKE        This is a list of the screening recommended for you and due dates:  Health Maintenance  Topic Date Due   Yearly kidney health urinalysis for diabetes  Never done   Hepatitis C Screening: USPSTF Recommendation to screen - Ages 37-79 yo.  Never done   Yearly kidney function blood test for diabetes  08/20/2021   Flu Shot  04/24/2022*   Zoster (Shingles) Vaccine (1 of 2) 05/10/2022*   Medicare Annual Wellness Visit  02/09/2023   DTaP/Tdap/Td vaccine (2 - Td or Tdap) 08/13/2023   Pneumonia Vaccine  Completed   HPV Vaccine  Aged Out   COVID-19 Vaccine  Discontinued  *Topic was postponed. The date shown is not the original due date.    Advanced directives: Advance directive discussed with you today. I have provided a copy for you to complete at home and have notarized. Once this is complete please bring a copy in to our office so we can scan it into your chart.   Conditions/risks identified: Aim for 30 minutes of exercise or brisk walking, 6-8 glasses of water, and 5 servings of fruits and vegetables each day.   Next appointment: Follow up in one year for your annual wellness visit.   Preventive Care 66 Years and Older, Male  Preventive care refers to lifestyle choices and visits with your health care provider that can promote health and wellness. What does preventive care include? A yearly physical exam. This is also called an annual well check. Dental exams once or twice a year. Routine eye exams. Ask your health care provider how often you should have your eyes checked. Personal lifestyle choices, including: Daily care of your teeth and gums. Regular physical  activity. Eating a healthy diet. Avoiding tobacco and drug use. Limiting alcohol use. Practicing safe sex. Taking low doses of aspirin every day. Taking vitamin and mineral supplements as recommended by your health care provider. What happens during an annual well check? The services and screenings done by your health care provider during your annual well check will depend on your age, overall health, lifestyle risk factors, and family history of disease. Counseling  Your health care provider may ask you questions about your: Alcohol use. Tobacco use. Drug use. Emotional well-being. Home and relationship well-being. Sexual activity. Eating habits. History of falls. Memory and ability to understand (cognition). Work and work Statistician. Screening  You may have the following tests or measurements: Height, weight, and BMI. Blood pressure. Lipid and cholesterol levels. These may be checked every 5 years, or more frequently if you are over 18 years old. Skin check. Lung cancer screening. You may have this screening every year starting at age 4 if you have a 30-pack-year history of smoking and currently smoke or have quit within the past 15 years. Fecal occult blood test (FOBT) of the stool. You may have this test every year starting at age 34. Flexible sigmoidoscopy or colonoscopy. You may have a sigmoidoscopy every 5 years or a colonoscopy every 10 years starting at age 60. Prostate cancer screening. Recommendations will vary depending on your family history and other  risks. Hepatitis C blood test. Hepatitis B blood test. Sexually transmitted disease (STD) testing. Diabetes screening. This is done by checking your blood sugar (glucose) after you have not eaten for a while (fasting). You may have this done every 1-3 years. Abdominal aortic aneurysm (AAA) screening. You may need this if you are a current or former smoker. Osteoporosis. You may be screened starting at age 64 if you are  at high risk. Talk with your health care provider about your test results, treatment options, and if necessary, the need for more tests. Vaccines  Your health care provider may recommend certain vaccines, such as: Influenza vaccine. This is recommended every year. Tetanus, diphtheria, and acellular pertussis (Tdap, Td) vaccine. You may need a Td booster every 10 years. Zoster vaccine. You may need this after age 60. Pneumococcal 13-valent conjugate (PCV13) vaccine. One dose is recommended after age 22. Pneumococcal polysaccharide (PPSV23) vaccine. One dose is recommended after age 54. Talk to your health care provider about which screenings and vaccines you need and how often you need them. This information is not intended to replace advice given to you by your health care provider. Make sure you discuss any questions you have with your health care provider. Document Released: 02/06/2015 Document Revised: 09/30/2015 Document Reviewed: 11/11/2014 Elsevier Interactive Patient Education  2017 [place] Prevention in the Home Falls can cause injuries. They can happen to people of all ages. There are many things you can do to make your home safe and to help prevent falls. What can I do on the outside of my home? Regularly fix the edges of walkways and driveways and fix any cracks. Remove anything that might make you trip as you walk through a door, such as a raised step or threshold. Trim any bushes or trees on the path to your home. Use bright outdoor lighting. Clear any walking paths of anything that might make someone trip, such as rocks or tools. Regularly check to see if handrails are loose or broken. Make sure that both sides of any steps have handrails. Any raised decks and porches should have guardrails on the edges. Have any leaves, snow, or ice cleared regularly. Use sand or salt on walking paths during winter. Clean up any spills in your garage right away. This includes oil  or grease spills. What can I do in the bathroom? Use night lights. Install grab bars by the toilet and in the tub and shower. Do not use towel bars as grab bars. Use non-skid mats or decals in the tub or shower. If you need to sit down in the shower, use a plastic, non-slip stool. Keep the floor dry. Clean up any water that spills on the floor as soon as it happens. Remove soap buildup in the tub or shower regularly. Attach bath mats securely with double-sided non-slip rug tape. Do not have throw rugs and other things on the floor that can make you trip. What can I do in the bedroom? Use night lights. Make sure that you have a light by your bed that is easy to reach. Do not use any sheets or blankets that are too big for your bed. They should not hang down onto the floor. Have a firm chair that has side arms. You can use this for support while you get dressed. Do not have throw rugs and other things on the floor that can make you trip. What can I do in the kitchen? Clean up any spills right away.  Avoid walking on wet floors. Keep items that you use a lot in easy-to-reach places. If you need to reach something above you, use a strong step stool that has a grab bar. Keep electrical cords out of the way. Do not use floor polish or wax that makes floors slippery. If you must use wax, use non-skid floor wax. Do not have throw rugs and other things on the floor that can make you trip. What can I do with my stairs? Do not leave any items on the stairs. Make sure that there are handrails on both sides of the stairs and use them. Fix handrails that are broken or loose. Make sure that handrails are as long as the stairways. Check any carpeting to make sure that it is firmly attached to the stairs. Fix any carpet that is loose or worn. Avoid having throw rugs at the top or bottom of the stairs. If you do have throw rugs, attach them to the floor with carpet tape. Make sure that you have a light  switch at the top of the stairs and the bottom of the stairs. If you do not have them, ask someone to add them for you. What else can I do to help prevent falls? Wear shoes that: Do not have high heels. Have rubber bottoms. Are comfortable and fit you well. Are closed at the toe. Do not wear sandals. If you use a stepladder: Make sure that it is fully opened. Do not climb a closed stepladder. Make sure that both sides of the stepladder are locked into place. Ask someone to hold it for you, if possible. Clearly mark and make sure that you can see: Any grab bars or handrails. First and last steps. Where the edge of each step is. Use tools that help you move around (mobility aids) if they are needed. These include: Canes. Walkers. Scooters. Crutches. Turn on the lights when you go into a dark area. Replace any light bulbs as soon as they burn out. Set up your furniture so you have a clear path. Avoid moving your furniture around. If any of your floors are uneven, fix them. If there are any pets around you, be aware of where they are. Review your medicines with your doctor. Some medicines can make you feel dizzy. This can increase your chance of falling. Ask your doctor what other things that you can do to help prevent falls. This information is not intended to replace advice given to you by your health care provider. Make sure you discuss any questions you have with your health care provider. Document Released: 11/06/2008 Document Revised: 06/18/2015 Document Reviewed: 02/14/2014 Elsevier Interactive Patient Education  2017 [employer].

## 2022-02-22 ENCOUNTER — Encounter: Payer: Self-pay | Admitting: Family Medicine

## 2022-02-22 ENCOUNTER — Ambulatory Visit: Payer: Medicare Other | Admitting: Family Medicine

## 2022-02-22 VITALS — BP 130/82 | HR 85 | Temp 97.6°F | Ht 75.0 in | Wt 227.6 lb

## 2022-02-22 DIAGNOSIS — E119 Type 2 diabetes mellitus without complications: Secondary | ICD-10-CM

## 2022-02-22 DIAGNOSIS — R0989 Other specified symptoms and signs involving the circulatory and respiratory systems: Secondary | ICD-10-CM | POA: Diagnosis not present

## 2022-02-22 LAB — CBC WITH DIFFERENTIAL/PLATELET
Basophils Absolute: 31 cells/uL (ref 0–200)
Hemoglobin: 15.1 g/dL (ref 13.2–17.1)
Lymphs Abs: 1568 cells/uL (ref 850–3900)
MCHC: 35.8 g/dL (ref 32.0–36.0)
MPV: 10.6 fL (ref 7.5–12.5)
Platelets: 246 10*3/uL (ref 140–400)
RBC: 4.46 10*6/uL (ref 4.20–5.80)
Total Lymphocyte: 25.7 %

## 2022-02-22 NOTE — Progress Notes (Signed)
Subjective:    Patient ID: Scott Ballard., male    DOB: 1945/10/24, 77 y.o.   MRN: [id number]  Patient is a very pleasant 77 year old Caucasian gentleman who I have not seen since 2022.  He has a past medical history of melanoma as well as hypertension and prediabetes.  He primarily came today for a checkup.  Patient has an atypical mole on his abdomen.  He has not seen his dermatologist in a while.  I strongly recommended that he follow-up with his dermatologist because I feel that that mole needs to be biopsied.  His blood pressure is excellent today.  He denies any chest pain shortness of breath or dyspnea on exertion.  He is due for a flu shot but he declines this.  He recently had a home exam where he was told that he has decreased pulses in his right foot.  Today on foot exam, he has decreased pedal pulses in the right foot specifically the dorsalis pedis.  I am unable to palpate this.  He has strong posterior tibialis pulses bilaterally however.  He denies any claudication. Past Medical History:  Diagnosis Date   Diabetes mellitus without complication ([place])    Diet controlled versus prediabetic, no longer on medication   Hypertension    Melanoma ([place])    left neck, excision at [facility] (05/2019) free margns and lymph node negative   Smoker    Past Surgical History:  Procedure Laterality Date   CHOLECYSTECTOMY N/A 09/25/2013   Procedure: LAPAROSCOPIC CHOLECYSTECTOMY;  Surgeon: Jamesetta So, MD;  Location: [facility];  Service: General;  Laterality: N/A;   KNEE ARTHROSCOPY     MELANOMA EXCISION     sinus     Current Outpatient Medications on File Prior to Visit  Medication Sig Dispense Refill   aspirin 81 MG tablet Take 81 mg by mouth daily.     cetirizine (ZYRTEC) 10 MG tablet Take 10 mg by mouth daily.     cholecalciferol (VITAMIN D3) 25 MCG (1000 UNIT) tablet Take 1,000 Units by mouth daily.     CINNAMON PO Take 1 capsule by mouth every other day.     Cyanocobalamin (VITAMIN B 12 PO)  Take 1 tablet by mouth daily.     lisinopril (ZESTRIL) 20 MG tablet TAKE 1 TABLET BY MOUTH EVERY DAY 60 tablet 0   No current facility-administered medications on file prior to visit.   No Known Allergies Social History   Socioeconomic History   Marital status: Married    Spouse name: Not on file   Number of children: Not on file   Years of education: Not on file   Highest education level: Not on file  Occupational History   Not on file  Tobacco Use   Smoking status: Some Days    Types: Cigars   Smokeless tobacco: Never   Tobacco comments:    chews on cigars  Substance and Sexual Activity   Alcohol use: No   Drug use: No   Sexual activity: Yes  Other Topics Concern   Not on file  Social History Narrative   Not on file   Social Determinants of Health   Financial Resource Strain: Low Risk  (02/08/2022)   Overall Financial Resource Strain (CARDIA)    Difficulty of Paying Living Expenses: Not hard at all  Food Insecurity: No Food Insecurity (02/08/2022)   Hunger Vital Sign    Worried About Running Out of Food in the Last Year: Never true  Ran Out of Food in the Last Year: Never true  Transportation Needs: No Transportation Needs (02/08/2022)   PRAPARE - Hydrologist (Medical): No    Lack of Transportation (Non-Medical): No  Physical Activity: Inactive (02/08/2022)   Exercise Vital Sign    Days of Exercise per Week: 0 days    Minutes of Exercise per Session: 0 min  Stress: No Stress Concern Present (02/08/2022)   [place]    Feeling of Stress : Not at all  Social Connections: Moderately Isolated (02/08/2022)   Social Connection and Isolation Panel [NHANES]    Frequency of Communication with Friends and Family: More than three times a week    Frequency of Social Gatherings with Friends and Family: Three times a week    Attends Religious Services: Never    Active Member of  Clubs or Organizations: No    Attends Archivist Meetings: Never    Marital Status: Married  Human resources officer Violence: Not At Risk (02/08/2022)   Humiliation, Afraid, Rape, and Kick questionnaire    Fear of Current or Ex-Partner: No    Emotionally Abused: No    Physically Abused: No    Sexually Abused: No   Family History  Problem Relation Age of Onset   Colon cancer Neg Hx       Review of Systems  All other systems reviewed and are negative.      Objective:   Physical Exam Vitals reviewed.  Constitutional:      General: He is not in acute distress.    Appearance: He is well-developed. He is not diaphoretic.  HENT:     Head: Normocephalic and atraumatic.     Right Ear: External ear normal.     Left Ear: External ear normal.     Nose: Nose normal.     Mouth/Throat:     Pharynx: No oropharyngeal exudate.  Eyes:     General:        Right eye: No discharge.        Left eye: No discharge.     Conjunctiva/sclera: Conjunctivae normal.     Pupils: Pupils are equal, round, and reactive to light.  Neck:     Thyroid: No thyromegaly.     Vascular: No JVD.     Trachea: No tracheal deviation.  Cardiovascular:     Rate and Rhythm: Normal rate and regular rhythm.     Heart sounds: Normal heart sounds. No murmur heard.    No friction rub. No gallop.  Pulmonary:     Effort: Pulmonary effort is normal. No respiratory distress.     Breath sounds: Normal breath sounds. No stridor. No wheezing or rales.  Chest:     Chest wall: No tenderness.  Abdominal:     General: Bowel sounds are normal. There is no distension.     Palpations: Abdomen is soft. There is no mass.     Tenderness: There is no abdominal tenderness. There is no guarding or rebound.  Musculoskeletal:        General: No tenderness or deformity. Normal range of motion.     Cervical back: Normal range of motion.  Lymphadenopathy:     Cervical: No cervical adenopathy.  Skin:    General: Skin is warm.      Findings: Rash present.  Neurological:     Mental Status: He is alert and oriented to person, place, and time.  Cranial Nerves: No cranial nerve deficit.     Motor: No abnormal muscle tone.     Coordination: Coordination normal.     Deep Tendon Reflexes: Reflexes normal.  Psychiatric:        Behavior: Behavior normal.        Thought Content: Thought content normal.        Judgment: Judgment normal.    widespread ak's on face, cheeks, forehead       Assessment & Plan:  Diabetes mellitus without complication ([place]) - Plan: CBC with Differential/Platelet, Lipid panel, COMPLETE METABOLIC PANEL WITH GFR, Hemoglobin A1c, Protein / Creatinine Ratio, Urine  Decreased pedal pulses - Plan: VAS [place] ABI WITH/WO TBI Strongly recommended a flu shot but he declined.  Recommended the shingles vaccine.  Given his age I would not recommend a colonoscopy or prostate cancer screening test.  I will check a CBC a CMP and A1c a lipid panel and a urine protein creatinine ratio.  I like to see his A1c less than 6.5, his LDL cholesterol less than 100 and his urine protein creatinine ratio less than 200.  Blood pressure is acceptable.  Schedule the patient for a vascular ultrasound to evaluate for peripheral artery disease as he has decreased pedal pulses in the right foot recommended he follow-up with his dermatologist given atypical mole on his anterior abdomen just below the xiphoid process

## 2022-02-23 LAB — COMPLETE METABOLIC PANEL WITH GFR
AG Ratio: 1.4 (calc) (ref 1.0–2.5)
ALT: 13 U/L (ref 9–46)
AST: 14 U/L (ref 10–35)
Albumin: 4.1 g/dL (ref 3.6–5.1)
Alkaline phosphatase (APISO): 57 U/L (ref 35–144)
BUN: 15 mg/dL (ref 7–25)
CO2: 26 mmol/L (ref 20–32)
Calcium: 9.7 mg/dL (ref 8.6–10.3)
Chloride: 104 mmol/L (ref 98–110)
Creat: 1.03 mg/dL (ref 0.70–1.28)
Globulin: 2.9 g/dL (calc) (ref 1.9–3.7)
Glucose, Bld: 153 mg/dL — ABNORMAL HIGH (ref 65–99)
Potassium: 4.2 mmol/L (ref 3.5–5.3)
Sodium: 139 mmol/L (ref 135–146)
Total Bilirubin: 0.7 mg/dL (ref 0.2–1.2)
Total Protein: 7 g/dL (ref 6.1–8.1)
eGFR: 74 mL/min/{1.73_m2} (ref >=60)

## 2022-02-23 LAB — PROTEIN / CREATININE RATIO, URINE
Creatinine, Urine: 248 mg/dL (ref 20–320)
Protein/Creat Ratio: 101 mg/g creat (ref 25–148)
Protein/Creatinine Ratio: 0.101 mg/mg creat (ref 0.025–0.148)
Total Protein, Urine: 25 mg/dL (ref 5–25)

## 2022-02-23 LAB — LIPID PANEL
Cholesterol: 155 mg/dL (ref <200)
HDL: 49 mg/dL (ref >=40)
LDL Cholesterol (Calc): 80 mg/dL (calc)
Non-HDL Cholesterol (Calc): 106 mg/dL (calc) (ref <130)
Total CHOL/HDL Ratio: 3.2 (calc) (ref <5.0)
Triglycerides: 154 mg/dL — ABNORMAL HIGH (ref <150)

## 2022-02-23 LAB — HEMOGLOBIN A1C
Hgb A1c MFr Bld: 6.5 % of total Hgb — ABNORMAL HIGH (ref <5.7)
Mean Plasma Glucose: 140 mg/dL
eAG (mmol/L): 7.7 mmol/L

## 2022-02-23 LAB — CBC WITH DIFFERENTIAL/PLATELET
Absolute Monocytes: 500 cells/uL (ref 200–950)
Basophils Relative: 0.5 %
Eosinophils Absolute: 79 cells/uL (ref 15–500)
Eosinophils Relative: 1.3 %
HCT: 42.2 % (ref 38.5–50.0)
MCH: 33.9 pg — ABNORMAL HIGH (ref 27.0–33.0)
MCV: 94.6 fL (ref 80.0–100.0)
Monocytes Relative: 8.2 %
Neutro Abs: 3922 cells/uL (ref 1500–7800)
Neutrophils Relative %: 64.3 %
RDW: 12.3 % (ref 11.0–15.0)
WBC: 6.1 10*3/uL (ref 3.8–10.8)

## 2022-02-24 ENCOUNTER — Other Ambulatory Visit: Payer: Self-pay

## 2022-02-24 MED ORDER — METFORMIN HCL ER 500 MG PO TB24: 1000.0000 mg | ORAL_TABLET | Freq: Every day | ORAL | 3 refills | Status: DC

## 2022-03-09 ENCOUNTER — Ambulatory Visit (HOSPITAL_COMMUNITY)
Admission: RE | Admit: 2022-03-09 | Discharge: 2022-03-09 | Disposition: A | Discharge status: Home / Self Care | Payer: Medicare Other | Source: Ambulatory Visit | Attending: Family Medicine | Admitting: Family Medicine

## 2022-03-09 DIAGNOSIS — R0989 Other specified symptoms and signs involving the circulatory and respiratory systems: Secondary | ICD-10-CM | POA: Diagnosis not present

## 2022-03-09 LAB — VAS US ABI WITH/WO TBI
Left ABI: 1.21
Right ABI: 1.18

## 2022-03-23 ENCOUNTER — Other Ambulatory Visit: Payer: Self-pay | Admitting: Family Medicine

## 2022-08-08 ENCOUNTER — Ambulatory Visit: Payer: Medicare Other | Admitting: Family Medicine

## 2022-08-20 ENCOUNTER — Other Ambulatory Visit: Payer: Self-pay | Admitting: Family Medicine

## 2022-10-17 ENCOUNTER — Other Ambulatory Visit: Payer: Self-pay | Admitting: Family Medicine

## 2022-10-18 NOTE — Telephone Encounter (Signed)
Requested medication (s) are due for refill today: Yes  Requested medication (s) are on the active medication list: Yes  Last refill:  08/22/22  Future visit scheduled: Yes  Notes to clinic:  Unable to refill per protocol, courtesy refill already given, routing for provider approval.      Requested Prescriptions  Pending Prescriptions Disp Refills   lisinopril (ZESTRIL) 20 MG tablet [Pharmacy Med Name: LISINOPRIL 20 MG TABLET] 90 tablet 1    Sig: TAKE 1 TABLET BY MOUTH EVERY DAY     Cardiovascular:  ACE Inhibitors Failed - 10/17/2022  9:30 AM      Failed - Cr in normal range and within 180 days    Creat  Date Value Ref Range Status  02/22/2022 1.03 0.70 - 1.28 mg/dL Final   Creatinine, Urine  Date Value Ref Range Status  02/22/2022 248 20 - 320 mg/dL Final         Failed - K in normal range and within 180 days    Potassium  Date Value Ref Range Status  02/22/2022 4.2 3.5 - 5.3 mmol/L Final         Failed - Valid encounter within last 6 months    Recent Outpatient Visits           2 years ago Diabetes mellitus without complication (HCC)   [facility] Pickard, Priscille Heidelberg, MD   5 years ago Prediabetes   [facility] Donita Brooks, MD   6 years ago Prediabetes   [facility] Donita Brooks, MD   7 years ago Controlled type 2 diabetes mellitus without complication, without long-term current use of insulin (HCC)   [facility] Pickard, Priscille Heidelberg, MD   8 years ago Prostate cancer screening   [facility] Medicine Pickard, Priscille Heidelberg, MD              Passed - Patient is not pregnant      Passed - Last BP in normal range    BP Readings from Last 1 Encounters:  02/22/22 130/82

## 2022-10-23 ENCOUNTER — Other Ambulatory Visit: Payer: Self-pay | Admitting: Family Medicine

## 2022-10-24 NOTE — Telephone Encounter (Signed)
Requested medication (s) are due for refill today: yes  Requested medication (s) are on the active medication list: yes  Last refill:  02/24/22 #90/3  Future visit scheduled: no  Notes to clinic:  Unable to refill per protocol due to failed labs, no updated results.      Requested Prescriptions  Pending Prescriptions Disp Refills   metFORMIN (GLUCOPHAGE-XR) 500 MG 24 hr tablet [Pharmacy Med Name: METFORMIN HCL ER 500 MG TABLET] 180 tablet 1    Sig: TAKE 2 TABLETS BY MOUTH EVERY DAY WITH BREAKFAST     Endocrinology:  Diabetes - Biguanides Failed - 10/23/2022  8:28 AM      Failed - HBA1C is between 0 and 7.9 and within 180 days    Hgb A1c MFr Bld  Date Value Ref Range Status  02/22/2022 6.5 (H) <5.7 % of total Hgb Final    Comment:    For someone without known diabetes, a hemoglobin A1c value of 6.5% or greater indicates that they may have  diabetes and this should be confirmed with a follow-up  test. . For someone with known diabetes, a value <7% indicates  that their diabetes is well controlled and a value  greater than or equal to 7% indicates suboptimal  control. A1c targets should be individualized based on  duration of diabetes, age, comorbid conditions, and  other considerations. . Currently, no consensus exists regarding use of hemoglobin A1c for diagnosis of diabetes for children. .          Failed - B12 Level in normal range and within 720 days    No results found for: "VITAMINB12"       Failed - Valid encounter within last 6 months    Recent Outpatient Visits           2 years ago Diabetes mellitus without complication (HCC)   [facility] Pickard, Priscille Heidelberg, MD   5 years ago Prediabetes   [facility] Tanya Nones, Priscille Heidelberg, MD   6 years ago Prediabetes   [facility] Donita Brooks, MD   7 years ago Controlled type 2 diabetes mellitus without complication, without long-term current use of insulin (HCC)    [facility] Medicine Donita Brooks, MD   8 years ago Prostate cancer screening   [facility] Medicine Pickard, Priscille Heidelberg, MD              Passed - Cr in normal range and within 360 days    Creat  Date Value Ref Range Status  02/22/2022 1.03 0.70 - 1.28 mg/dL Final   Creatinine, Urine  Date Value Ref Range Status  02/22/2022 248 20 - 320 mg/dL Final         Passed - eGFR in normal range and within 360 days    GFR, Est African American  Date Value Ref Range Status  08/04/2017 91 > OR = 60 mL/min/1.53m2 Final   GFR, Est Non African American  Date Value Ref Range Status  08/04/2017 79 > OR = 60 mL/min/1.67m2 Final   eGFR  Date Value Ref Range Status  02/22/2022 74 > OR = 60 mL/min/1.22m2 Final         Passed - CBC within normal limits and completed in the last 12 months    WBC  Date Value Ref Range Status  02/22/2022 6.1 3.8 - 10.8 Thousand/uL Final   RBC  Date Value Ref Range Status  02/22/2022 4.46 4.20 -  5.80 Million/uL Final   Hemoglobin  Date Value Ref Range Status  02/22/2022 15.1 13.2 - 17.1 g/dL Final   HCT  Date Value Ref Range Status  02/22/2022 42.2 38.5 - 50.0 % Final   MCHC  Date Value Ref Range Status  02/22/2022 35.8 32.0 - 36.0 g/dL Final   [facility]  Date Value Ref Range Status  02/22/2022 33.9 (H) 27.0 - 33.0 pg Final   MCV  Date Value Ref Range Status  02/22/2022 94.6 80.0 - 100.0 fL Final   No results found for: "PLTCOUNTKUC", "LABPLAT", "POCPLA" RDW  Date Value Ref Range Status  02/22/2022 12.3 11.0 - 15.0 % Final

## 2022-10-28 ENCOUNTER — Other Ambulatory Visit: Payer: Self-pay

## 2022-10-28 DIAGNOSIS — I10 Essential (primary) hypertension: Secondary | ICD-10-CM

## 2022-10-28 MED ORDER — LISINOPRIL 20 MG PO TABS: 20.0000 mg | ORAL_TABLET | Freq: Every day | ORAL | 0 refills | Status: DC

## 2022-11-01 ENCOUNTER — Encounter: Payer: Self-pay | Admitting: Family Medicine

## 2022-11-01 ENCOUNTER — Ambulatory Visit: Payer: Medicare Other | Admitting: Family Medicine

## 2022-11-01 VITALS — BP 118/72 | HR 55 | Temp 97.5°F | Ht 75.0 in | Wt 227.0 lb

## 2022-11-01 DIAGNOSIS — E119 Type 2 diabetes mellitus without complications: Secondary | ICD-10-CM | POA: Diagnosis not present

## 2022-11-01 DIAGNOSIS — I10 Essential (primary) hypertension: Secondary | ICD-10-CM

## 2022-11-01 MED ORDER — ROSUVASTATIN CALCIUM 10 MG PO TABS: 10.0000 mg | ORAL_TABLET | Freq: Every day | ORAL | 3 refills | Status: DC

## 2022-11-01 NOTE — Progress Notes (Signed)
Subjective:    Patient ID: Scott Ballard., male    DOB: 05/04/1945, 77 y.o.   MRN: [id number]  Patient is here today for a checkup.  He is a very pleasant 77 year old Caucasian gentleman with a history of diabetes mellitus.  At his last visit, he had diminished pedal pulses on his exam.  I did obtain vascular ultrasound evaluate further.  His ABI was normal bilaterally but his TBI was markedly diminished.  For that reason I have recommended statin therapy along with his diabetes.  He is currently on aspirin 81 mg a day.  He denies any chest pain shortness of breath or dyspnea on exertion.  He declines a flu shot Past Medical History:  Diagnosis Date   Diabetes mellitus without complication (HCC)    Diet controlled versus prediabetic, no longer on medication   Hypertension    Melanoma (HCC)    left neck, excision at [facility] (05/2019) free margns and lymph node negative   Smoker    Past Surgical History:  Procedure Laterality Date   CHOLECYSTECTOMY N/A 09/25/2013   Procedure: LAPAROSCOPIC CHOLECYSTECTOMY;  Surgeon: Dalia Heading, MD;  Location: [facility];  Service: General;  Laterality: N/A;   KNEE ARTHROSCOPY     MELANOMA EXCISION     sinus     Current Outpatient Medications on File Prior to Visit  Medication Sig Dispense Refill   aspirin 81 MG tablet Take 81 mg by mouth daily.     cetirizine (ZYRTEC) 10 MG tablet Take 10 mg by mouth daily.     cholecalciferol (VITAMIN D3) 25 MCG (1000 UNIT) tablet Take 1,000 Units by mouth daily.     CINNAMON PO Take 1 capsule by mouth every other day.     Cyanocobalamin (VITAMIN B 12 PO) Take 1 tablet by mouth daily.     lisinopril (ZESTRIL) 20 MG tablet Take 1 tablet (20 mg total) by mouth daily. 30 tablet 0   metFORMIN (GLUCOPHAGE-XR) 500 MG 24 hr tablet Take 2 tablets (1,000 mg total) by mouth daily with breakfast. 90 tablet 3   No current facility-administered medications on file prior to visit.   No Known Allergies Social History   Socioeconomic  History   Marital status: Married    Spouse name: Not on file   Number of children: Not on file   Years of education: Not on file   Highest education level: Not on file  Occupational History   Not on file  Tobacco Use   Smoking status: Some Days    Types: Cigars   Smokeless tobacco: Never   Tobacco comments:    chews on cigars  Substance and Sexual Activity   Alcohol use: No   Drug use: No   Sexual activity: Yes  Other Topics Concern   Not on file  Social History Narrative   Not on file   Social Determinants of Health   Financial Resource Strain: Low Risk  (02/08/2022)   Overall Financial Resource Strain (CARDIA)    Difficulty of Paying Living Expenses: Not hard at all  Food Insecurity: No Food Insecurity (02/08/2022)   Hunger Vital Sign    Worried About Running Out of Food in the Last Year: Never true    Ran Out of Food in the Last Year: Never true  Transportation Needs: No Transportation Needs (02/08/2022)   PRAPARE - Administrator, Civil Service (Medical): No    Lack of Transportation (Non-Medical): No  Physical Activity: Inactive (02/08/2022)  Exercise Vital Sign    Days of Exercise per Week: 0 days    Minutes of Exercise per Session: 0 min  Stress: No Stress Concern Present (02/08/2022)   [employer] of Occupational Health - Occupational Stress Questionnaire    Feeling of Stress : Not at all  Social Connections: Moderately Isolated (02/08/2022)   Social Connection and Isolation Panel [NHANES]    Frequency of Communication with Friends and Family: More than three times a week    Frequency of Social Gatherings with Friends and Family: Three times a week    Attends Religious Services: Never    Active Member of Clubs or Organizations: No    Attends Banker Meetings: Never    Marital Status: Married  Catering manager Violence: Not At Risk (02/08/2022)   Humiliation, Afraid, Rape, and Kick questionnaire    Fear of Current or Ex-Partner: No     Emotionally Abused: No    Physically Abused: No    Sexually Abused: No   Family History  Problem Relation Age of Onset   Colon cancer Neg Hx       Review of Systems  All other systems reviewed and are negative.      Objective:   Physical Exam Vitals reviewed.  Constitutional:      General: He is not in acute distress.    Appearance: He is well-developed. He is not diaphoretic.  HENT:     Head: Normocephalic and atraumatic.     Right Ear: External ear normal.     Left Ear: External ear normal.     Nose: Nose normal.     Mouth/Throat:     Pharynx: No oropharyngeal exudate.  Eyes:     General:        Right eye: No discharge.        Left eye: No discharge.     Conjunctiva/sclera: Conjunctivae normal.     Pupils: Pupils are equal, round, and reactive to light.  Neck:     Thyroid: No thyromegaly.     Vascular: No JVD.     Trachea: No tracheal deviation.  Cardiovascular:     Rate and Rhythm: Normal rate and regular rhythm.     Heart sounds: Normal heart sounds. No murmur heard.    No friction rub. No gallop.  Pulmonary:     Effort: Pulmonary effort is normal. No respiratory distress.     Breath sounds: Normal breath sounds. No stridor. No wheezing or rales.  Chest:     Chest wall: No tenderness.  Abdominal:     General: Bowel sounds are normal. There is no distension.     Palpations: Abdomen is soft. There is no mass.     Tenderness: There is no abdominal tenderness. There is no guarding or rebound.  Musculoskeletal:        General: No tenderness or deformity. Normal range of motion.     Cervical back: Normal range of motion.  Lymphadenopathy:     Cervical: No cervical adenopathy.  Skin:    General: Skin is warm.     Findings: Rash present.  Neurological:     Mental Status: He is alert and oriented to person, place, and time.     Cranial Nerves: No cranial nerve deficit.     Motor: No abnormal muscle tone.     Coordination: Coordination normal.     Deep  Tendon Reflexes: Reflexes normal.  Psychiatric:        Behavior: Behavior normal.  Thought Content: Thought content normal.        Judgment: Judgment normal.    widespread ak's on face, cheeks, forehead       Assessment & Plan:  Diabetes mellitus without complication (HCC) - Plan: Hemoglobin A1c, CBC with Differential/Platelet, COMPLETE METABOLIC PANEL WITH GFR, Lipid panel  Primary hypertension Recommended a flu shot today but patient declined.  Blood pressure is excellent.  Check an A1c.  Goal A1c is less than 6.5.  Start the patient on Crestor 10 mg a day due to peripheral vascular disease.  Goal LDL cholesterol is less than 100.  Monitor CMP.

## 2022-11-02 LAB — CBC WITH DIFFERENTIAL/PLATELET
Absolute Monocytes: 594 {cells}/uL (ref 200–950)
Basophils Absolute: 39 {cells}/uL (ref 0–200)
Basophils Relative: 0.7 %
Eosinophils Absolute: 202 {cells}/uL (ref 15–500)
Eosinophils Relative: 3.6 %
HCT: 44.2 % (ref 38.5–50.0)
Hemoglobin: 14.9 g/dL (ref 13.2–17.1)
Lymphs Abs: 1450 {cells}/uL (ref 850–3900)
MCH: 33 pg (ref 27.0–33.0)
MCHC: 33.7 g/dL (ref 32.0–36.0)
MCV: 98 fL (ref 80.0–100.0)
MPV: 10.7 fL (ref 7.5–12.5)
Monocytes Relative: 10.6 %
Neutro Abs: 3315 {cells}/uL (ref 1500–7800)
Neutrophils Relative %: 59.2 %
Platelets: 236 10*3/uL (ref 140–400)
RBC: 4.51 10*6/uL (ref 4.20–5.80)
RDW: 12.1 % (ref 11.0–15.0)
Total Lymphocyte: 25.9 %
WBC: 5.6 10*3/uL (ref 3.8–10.8)

## 2022-11-02 LAB — COMPLETE METABOLIC PANEL WITH GFR
AG Ratio: 1.6 (calc) (ref 1.0–2.5)
ALT: 16 U/L (ref 9–46)
AST: 16 U/L (ref 10–35)
Albumin: 4.1 g/dL (ref 3.6–5.1)
Alkaline phosphatase (APISO): 64 U/L (ref 35–144)
BUN: 12 mg/dL (ref 7–25)
CO2: 26 mmol/L (ref 20–32)
Calcium: 9.8 mg/dL (ref 8.6–10.3)
Chloride: 106 mmol/L (ref 98–110)
Creat: 0.89 mg/dL (ref 0.70–1.28)
Globulin: 2.6 g/dL (ref 1.9–3.7)
Glucose, Bld: 146 mg/dL — ABNORMAL HIGH (ref 65–99)
Potassium: 4.4 mmol/L (ref 3.5–5.3)
Sodium: 140 mmol/L (ref 135–146)
Total Bilirubin: 0.7 mg/dL (ref 0.2–1.2)
Total Protein: 6.7 g/dL (ref 6.1–8.1)
eGFR: 87 mL/min/{1.73_m2} (ref >=60)

## 2022-11-02 LAB — HEMOGLOBIN A1C
Hgb A1c MFr Bld: 6.4 %{Hb} — ABNORMAL HIGH (ref <5.7)
Mean Plasma Glucose: 137 mg/dL
eAG (mmol/L): 7.6 mmol/L

## 2022-11-02 LAB — LIPID PANEL
Cholesterol: 167 mg/dL (ref <200)
HDL: 50 mg/dL (ref >=40)
LDL Cholesterol (Calc): 98 mg/dL
Non-HDL Cholesterol (Calc): 117 mg/dL (ref <130)
Total CHOL/HDL Ratio: 3.3 (calc) (ref <5.0)
Triglycerides: 95 mg/dL (ref <150)

## 2022-11-27 ENCOUNTER — Other Ambulatory Visit: Payer: Self-pay | Admitting: Family Medicine

## 2022-11-27 DIAGNOSIS — I10 Essential (primary) hypertension: Secondary | ICD-10-CM

## 2022-11-28 NOTE — Telephone Encounter (Signed)
Requested Prescriptions  Pending Prescriptions Disp Refills   lisinopril (ZESTRIL) 20 MG tablet [Pharmacy Med Name: LISINOPRIL 20 MG TABLET] 90 tablet 1    Sig: TAKE 1 TABLET BY MOUTH EVERY DAY     Cardiovascular:  ACE Inhibitors Failed - 11/27/2022  8:47 AM      Failed - Valid encounter within last 6 months    Recent Outpatient Visits           2 years ago Diabetes mellitus without complication (HCC)   [facility] Pickard, Priscille Heidelberg, MD   5 years ago Prediabetes   [facility] Tanya Nones, Priscille Heidelberg, MD   6 years ago Prediabetes   [facility] Tanya Nones, Priscille Heidelberg, MD   7 years ago Controlled type 2 diabetes mellitus without complication, without long-term current use of insulin (HCC)   [facility] Pickard, Priscille Heidelberg, MD   8 years ago Prostate cancer screening   [facility] Medicine Pickard, Priscille Heidelberg, MD              Passed - Cr in normal range and within 180 days    Creat  Date Value Ref Range Status  11/01/2022 0.89 0.70 - 1.28 mg/dL Final   Creatinine, Urine  Date Value Ref Range Status  02/22/2022 248 20 - 320 mg/dL Final         Passed - K in normal range and within 180 days    Potassium  Date Value Ref Range Status  11/01/2022 4.4 3.5 - 5.3 mmol/L Final         Passed - Patient is not pregnant      Passed - Last BP in normal range    BP Readings from Last 1 Encounters:  11/01/22 118/72

## 2023-03-23 ENCOUNTER — Ambulatory Visit: Payer: Medicare Other | Admitting: *Deleted

## 2023-03-23 DIAGNOSIS — Z0000 Encounter for general adult medical examination without abnormal findings: Secondary | ICD-10-CM

## 2023-03-23 NOTE — Progress Notes (Signed)
 Subjective:   Scott Ballard. is a 78 y.o. male who presents for Medicare Annual/Subsequent preventive examination.  Visit Complete: Virtual I connected with  Cristine Polio. on 03/23/23 by a audio enabled telemedicine application and verified that I am speaking with the correct person using two identifiers.  Patient Location: Home  Provider Location: Home Office  I discussed the limitations of evaluation and management by telemedicine. The patient expressed understanding and agreed to proceed.  Vital Signs: Because this visit was a virtual/telehealth visit, some criteria may be missing or patient reported. Any vitals not documented were not able to be obtained and vitals that have been documented are patient reported.  Cardiac Risk Factors include: advanced age (>29men, >103 women);male gender;hypertension;smoking/ tobacco exposure     Objective:    There were no vitals filed for this visit. There is no height or weight on file to calculate BMI.     03/23/2023    2:02 PM 02/08/2022    2:56 PM 05/20/2017   11:45 AM 09/25/2013    9:24 AM 09/23/2013    3:00 PM  Advanced Directives  Does Patient Have a Medical Advance Directive? Yes No Yes No No  Type of Advance Directive Living will  Healthcare Power of [place];Living will    Would patient like information on creating a medical advance directive?  Yes (MAU/Ambulatory/Procedural Areas - Information given)  No - patient declined information     Current Medications (verified) Outpatient Encounter Medications as of 03/23/2023  Medication Sig   aspirin 81 MG tablet Take 81 mg by mouth daily.   cetirizine (ZYRTEC) 10 MG tablet Take 10 mg by mouth daily.   cholecalciferol (VITAMIN D3) 25 MCG (1000 UNIT) tablet Take 1,000 Units by mouth daily.   CINNAMON PO Take 1 capsule by mouth every other day.   Cyanocobalamin (VITAMIN B 12 PO) Take 1 tablet by mouth daily.   lisinopril (ZESTRIL) 20 MG tablet TAKE 1 TABLET BY MOUTH EVERY DAY    metFORMIN (GLUCOPHAGE-XR) 500 MG 24 hr tablet TAKE 2 TABLETS BY MOUTH EVERY DAY WITH BREAKFAST   rosuvastatin (CRESTOR) 10 MG tablet Take 1 tablet (10 mg total) by mouth daily.   No facility-administered encounter medications on file as of 03/23/2023.    Allergies (verified) Patient has no known allergies.   History: Past Medical History:  Diagnosis Date   Diabetes mellitus without complication (HCC)    Diet controlled versus prediabetic, no longer on medication   Hypertension    Melanoma (HCC)    left neck, excision at [facility] (05/2019) free margns and lymph node negative   Smoker    Past Surgical History:  Procedure Laterality Date   CHOLECYSTECTOMY N/A 09/25/2013   Procedure: LAPAROSCOPIC CHOLECYSTECTOMY;  Surgeon: Dalia Heading, MD;  Location: [facility];  Service: General;  Laterality: N/A;   KNEE ARTHROSCOPY     MELANOMA EXCISION     sinus     Family History  Problem Relation Age of Onset   Colon cancer Neg Hx    Social History   Socioeconomic History   Marital status: Married    Spouse name: Not on file   Number of children: Not on file   Years of education: Not on file   Highest education level: Not on file  Occupational History   Not on file  Tobacco Use   Smoking status: Some Days    Types: Cigars   Smokeless tobacco: Never   Tobacco comments:  chews on cigars  Substance and Sexual Activity   Alcohol use: No   Drug use: No   Sexual activity: Yes  Other Topics Concern   Not on file  Social History Narrative   Not on file   Social Drivers of Health   Financial Resource Strain: Low Risk  (03/23/2023)   Overall Financial Resource Strain (CARDIA)    Difficulty of Paying Living Expenses: Not hard at all  Food Insecurity: No Food Insecurity (03/23/2023)   Hunger Vital Sign    Worried About Running Out of Food in the Last Year: Never true    Ran Out of Food in the Last Year: Never true  Transportation Needs: No Transportation Needs (03/23/2023)   PRAPARE -  Administrator, Civil Service (Medical): No    Lack of Transportation (Non-Medical): No  Physical Activity: Insufficiently Active (03/23/2023)   Exercise Vital Sign    Days of Exercise per Week: 3 days    Minutes of Exercise per Session: 30 min  Stress: No Stress Concern Present (03/23/2023)   [employer] of Occupational Health - Occupational Stress Questionnaire    Feeling of Stress : Not at all  Social Connections: Moderately Integrated (03/23/2023)   Social Connection and Isolation Panel [NHANES]    Frequency of Communication with Friends and Family: Twice a week    Frequency of Social Gatherings with Friends and Family: Three times a week    Attends Religious Services: Never    Active Member of Clubs or Organizations: Yes    Attends Engineer, structural: More than 4 times per year    Marital Status: Married    Tobacco Counseling Ready to quit: Not Answered Counseling given: Not Answered Tobacco comments: chews on cigars   Clinical Intake:  Pre-visit preparation completed: Yes  Pain : No/denies pain     Diabetes: Yes CBG done?: No Did pt. bring in CBG monitor from home?: No  How often do you need to have someone help you when you read instructions, pamphlets, or other written materials from your doctor or pharmacy?: 1 - Never  Interpreter Needed?: No  Information entered by :: Remi Haggard LPN   Activities of Daily Living    03/23/2023    2:01 PM  In your present state of health, do you have any difficulty performing the following activities:  Hearing? 1  Vision? 0  Difficulty concentrating or making decisions? 0  Walking or climbing stairs? 0  Dressing or bathing? 0  Doing errands, shopping? 0  Preparing Food and eating ? N  Using the Toilet? N  In the past six months, have you accidently leaked urine? N  Do you have problems with loss of bowel control? N  Managing your Medications? N  Managing your Finances? N  Housekeeping or  managing your Housekeeping? N    Patient Care Team: Donita Brooks, MD as PCP - General (Family Medicine) West Bali, MD (Inactive) as Consulting Physician (Gastroenterology) Franki Monte, NP as Nurse Practitioner (Nurse Practitioner)  Indicate any recent Medical Services you may have received from other than [facility] providers in the past year (date may be approximate).     Assessment:   This is a routine wellness examination for Scott Ballard.  Hearing/Vision screen Hearing Screening - Comments:: Goes to [place] In Process of getting hearing aids [facility]   Goals Addressed             This Visit's Progress  Patient Stated       Sleep better       Depression Screen    03/23/2023    2:05 PM 02/22/2022   11:58 AM 02/08/2022    2:53 PM 08/20/2020   11:20 AM 08/04/2017    3:11 PM 08/12/2013    3:39 PM  PHQ 2/9 Scores  PHQ - 2 Score 2 0 0 0 0 1  PHQ- 9 Score 3         Fall Risk    03/23/2023    1:59 PM 02/22/2022   11:58 AM 02/08/2022    2:52 PM 08/20/2020   11:20 AM 08/04/2017    3:11 PM  Fall Risk   Falls in the past year? 0 0 0 0 No  Number falls in past yr: 0 0 0 0   Injury with Fall? 0 0 0 0   Risk for fall due to :  No Fall Risks No Fall Risks No Fall Risks   Follow up Falls evaluation completed;Education provided;Falls prevention discussed Falls prevention discussed Falls evaluation completed;Education provided;Falls prevention discussed Falls evaluation completed     MEDICARE RISK AT HOME: Medicare Risk at Home Any stairs in or around the home?: Yes If so, are there any without handrails?: No Home free of loose throw rugs in walkways, pet beds, electrical cords, etc?: Yes Adequate lighting in your home to reduce risk of falls?: Yes Life alert?: No Use of a cane, walker or w/c?: No Grab bars in the bathroom?: No Shower chair or bench in shower?: No Elevated toilet seat or a handicapped toilet?: No  TIMED UP AND GO:  Was  the test performed?  No    Cognitive Function:        03/23/2023    2:02 PM 02/08/2022    2:56 PM  6CIT Screen  What Year? 0 points 0 points  What month? 0 points 0 points  What time? 0 points 0 points  Count back from 20 0 points 0 points  Months in reverse 4 points 0 points  Repeat phrase 4 points 0 points  Total Score 8 points 0 points    Immunizations Immunization History  Administered Date(s) Administered   Influenza,inj,Quad PF,6+ Mos 02/01/2016   Pneumococcal Conjugate-13 08/12/2013   Pneumococcal Polysaccharide-23 06/06/2007, 02/05/2016   Tdap 08/12/2013    TDAP status: Up to date  Flu Vaccine status: Due, Education has been provided regarding the importance of this vaccine. Advised may receive this vaccine at local pharmacy or Health Dept. Aware to provide a copy of the vaccination record if obtained from local pharmacy or Health Dept. Verbalized acceptance and understanding.  Pneumococcal vaccine status: Up to date  Covid-19 vaccine status: Information provided on how to obtain vaccines.   Qualifies for Shingles Vaccine? Yes   Zostavax completed Yes   Shingrix Completed?: No.    Education has been provided regarding the importance of this vaccine. Patient has been advised to call insurance company to determine out of pocket expense if they have not yet received this vaccine. Advised may also receive vaccine at local pharmacy or Health Dept. Verbalized acceptance and understanding.  Screening Tests Health Maintenance  Topic Date Due   Zoster Vaccines- Shingrix (1 of 2) Never done   OPHTHALMOLOGY EXAM  12/20/2016   FOOT EXAM  08/05/2018   Diabetic kidney evaluation - Urine ACR  02/23/2023   INFLUENZA VACCINE  04/24/2023 (Originally 08/25/2022)   HEMOGLOBIN A1C  05/02/2023   DTaP/Tdap/Td (2 - Td or  Tdap) 08/13/2023   Diabetic kidney evaluation - eGFR measurement  11/01/2023   Medicare Annual Wellness (AWV)  03/22/2024   Pneumonia Vaccine 61+ Years old  Completed    HPV VACCINES  Aged Out   COVID-19 Vaccine  Discontinued    Health Maintenance  Health Maintenance Due  Topic Date Due   Zoster Vaccines- Shingrix (1 of 2) Never done   OPHTHALMOLOGY EXAM  12/20/2016   FOOT EXAM  08/05/2018   Diabetic kidney evaluation - Urine ACR  02/23/2023    Colorectal cancer screening: No longer required.   Lung Cancer Screening: (Low Dose CT Chest recommended if Age 71-80 years, 20 pack-year currently smoking OR have quit w/in 15years.) does not qualify.   Lung Cancer Screening Referral:   Additional Screening:  Hepatitis C Screening: does not qualify  Vision Screening: Recommended annual ophthalmology exams for early detection of glaucoma and other disorders of the eye. Is the patient up to date with their annual eye exam?  Yes  Who is the provider or what is the name of the office in which the patient attends [facility] exams[facility] If pt is not established with a provider, would they like to be referred to a provider to establish care? No .   Dental Screening: Recommended annual dental exams for proper oral hygiene  Nutrition Risk Assessment:  Has the patient had any N/V/D within the last 2 months?  No  Does the patient have any non-healing wounds?  No  Has the patient had any unintentional weight loss or weight gain?  No   Diabetes:  Is the patient diabetic?  Yes  If diabetic, was a CBG obtained today?  No  Did the patient bring in their glucometer from home?  Yes  How often do you monitor your CBG's? Does not check.   Financial Strains and Diabetes Management:  Are you having any financial strains with the device, your supplies or your medication? No .  Does the patient want to be seen by Chronic Care Management for management of their diabetes?  No  Would the patient like to be referred to a Nutritionist or for Diabetic Management?  No   Diabetic Exams:  Diabetic Eye Exam: Completed .  Pt has been advised about the importance in  completing this exam  Diabetic Foot Exam: . Pt has been advised about the importance in completing this exam..    Community Resource Referral / Chronic Care Management: CRR required this visit?  No   CCM required this visit?  No     Plan:     I have personally reviewed and noted the following in the patient's chart:   Medical and social history Use of alcohol, tobacco or illicit drugs  Current medications and supplements including opioid prescriptions. Patient is not currently taking opioid prescriptions. Functional ability and status Nutritional status Physical activity Advanced directives List of other physicians Hospitalizations, surgeries, and ER visits in previous 12 months Vitals Screenings to include cognitive, depression, and falls Referrals and appointments  In addition, I have reviewed and discussed with patient certain preventive protocols, quality metrics, and best practice recommendations. A written personalized care plan for preventive services as well as general preventive health recommendations were provided to patient.     Remi Haggard, LPN   1/61/0960   After Visit Summary: (Mail) Due to this being a telephonic visit, the after visit summary with patients personalized plan was offered to patient via mail   Nurse Notes:

## 2023-03-23 NOTE — Patient Instructions (Signed)
 Mr. Scott Ballard , Thank you for taking time to come for your Medicare Wellness Visit. I appreciate your ongoing commitment to your health goals. Please review the following plan we discussed and let me know if I can assist you in the future.   Screening recommendations/referrals: Colonoscopy: no longer required Recommended yearly ophthalmology/optometry visit for glaucoma screening and checkup Recommended yearly dental visit for hygiene and checkup  Vaccinations: Influenza vaccine: Education provided Pneumococcal vaccine: up to date Tdap vaccine: up to date Shingles vaccine: Education provided    Advanced directives: Education provided    Preventive Care 65 Years and Older, Male Preventive care refers to lifestyle choices and visits with your health care provider that can promote health and wellness. What does preventive care include? A yearly physical exam. This is also called an annual well check. Dental exams once or twice a year. Routine eye exams. Ask your health care provider how often you should have your eyes checked. Personal lifestyle choices, including: Daily care of your teeth and gums. Regular physical activity. Eating a healthy diet. Avoiding tobacco and drug use. Limiting alcohol use. Practicing safe sex. Taking low doses of aspirin every day. Taking vitamin and mineral supplements as recommended by your health care provider. What happens during an annual well check? The services and screenings done by your health care provider during your annual well check will depend on your age, overall health, lifestyle risk factors, and family history of disease. Counseling  Your health care provider may ask you questions about your: Alcohol use. Tobacco use. Drug use. Emotional well-being. Home and relationship well-being. Sexual activity. Eating habits. History of falls. Memory and ability to understand (cognition). Work and work Astronomer. Screening  You may have  the following tests or measurements: Height, weight, and BMI. Blood pressure. Lipid and cholesterol levels. These may be checked every 5 years, or more frequently if you are over 32 years old. Skin check. Lung cancer screening. You may have this screening every year starting at age 16 if you have a 30-pack-year history of smoking and currently smoke or have quit within the past 15 years. Fecal occult blood test (FOBT) of the stool. You may have this test every year starting at age 53. Flexible sigmoidoscopy or colonoscopy. You may have a sigmoidoscopy every 5 years or a colonoscopy every 10 years starting at age 6. Prostate cancer screening. Recommendations will vary depending on your family history and other risks. Hepatitis C blood test. Hepatitis B blood test. Sexually transmitted disease (STD) testing. Diabetes screening. This is done by checking your blood sugar (glucose) after you have not eaten for a while (fasting). You may have this done every 1-3 years. Abdominal aortic aneurysm (AAA) screening. You may need this if you are a current or former smoker. Osteoporosis. You may be screened starting at age 71 if you are at high risk. Talk with your health care provider about your test results, treatment options, and if necessary, the need for more tests. Vaccines  Your health care provider may recommend certain vaccines, such as: Influenza vaccine. This is recommended every year. Tetanus, diphtheria, and acellular pertussis (Tdap, Td) vaccine. You may need a Td booster every 10 years. Zoster vaccine. You may need this after age 58. Pneumococcal 13-valent conjugate (PCV13) vaccine. One dose is recommended after age 84. Pneumococcal polysaccharide (PPSV23) vaccine. One dose is recommended after age 72. Talk to your health care provider about which screenings and vaccines you need and how often you need them. This information  is not intended to replace advice given to you by your health  care provider. Make sure you discuss any questions you have with your health care provider. Document Released: 02/06/2015 Document Revised: 09/30/2015 Document Reviewed: 11/11/2014 Elsevier Interactive Patient Education  2017 [employer].  Fall Prevention in the Home Falls can cause injuries. They can happen to people of all ages. There are many things you can do to make your home safe and to help prevent falls. What can I do on the outside of my home? Regularly fix the edges of walkways and driveways and fix any cracks. Remove anything that might make you trip as you walk through a door, such as a raised step or threshold. Trim any bushes or trees on the path to your home. Use bright outdoor lighting. Clear any walking paths of anything that might make someone trip, such as rocks or tools. Regularly check to see if handrails are loose or broken. Make sure that both sides of any steps have handrails. Any raised decks and porches should have guardrails on the edges. Have any leaves, snow, or ice cleared regularly. Use sand or salt on walking paths during winter. Clean up any spills in your garage right away. This includes oil or grease spills. What can I do in the bathroom? Use night lights. Install grab bars by the toilet and in the tub and shower. Do not use towel bars as grab bars. Use non-skid mats or decals in the tub or shower. If you need to sit down in the shower, use a plastic, non-slip stool. Keep the floor dry. Clean up any water that spills on the floor as soon as it happens. Remove soap buildup in the tub or shower regularly. Attach bath mats securely with double-sided non-slip rug tape. Do not have throw rugs and other things on the floor that can make you trip. What can I do in the bedroom? Use night lights. Make sure that you have a light by your bed that is easy to reach. Do not use any sheets or blankets that are too big for your bed. They should not hang down onto the  floor. Have a firm chair that has side arms. You can use this for support while you get dressed. Do not have throw rugs and other things on the floor that can make you trip. What can I do in the kitchen? Clean up any spills right away. Avoid walking on wet floors. Keep items that you use a lot in easy-to-reach places. If you need to reach something above you, use a strong step stool that has a grab bar. Keep electrical cords out of the way. Do not use floor polish or wax that makes floors slippery. If you must use wax, use non-skid floor wax. Do not have throw rugs and other things on the floor that can make you trip. What can I do with my stairs? Do not leave any items on the stairs. Make sure that there are handrails on both sides of the stairs and use them. Fix handrails that are broken or loose. Make sure that handrails are as long as the stairways. Check any carpeting to make sure that it is firmly attached to the stairs. Fix any carpet that is loose or worn. Avoid having throw rugs at the top or bottom of the stairs. If you do have throw rugs, attach them to the floor with carpet tape. Make sure that you have a light switch at the top of  the stairs and the bottom of the stairs. If you do not have them, ask someone to add them for you. What else can I do to help prevent falls? Wear shoes that: Do not have high heels. Have rubber bottoms. Are comfortable and fit you well. Are closed at the toe. Do not wear sandals. If you use a stepladder: Make sure that it is fully opened. Do not climb a closed stepladder. Make sure that both sides of the stepladder are locked into place. Ask someone to hold it for you, if possible. Clearly mark and make sure that you can see: Any grab bars or handrails. First and last steps. Where the edge of each step is. Use tools that help you move around (mobility aids) if they are needed. These include: Canes. Walkers. Scooters. Crutches. Turn on the  lights when you go into a dark area. Replace any light bulbs as soon as they burn out. Set up your furniture so you have a clear path. Avoid moving your furniture around. If any of your floors are uneven, fix them. If there are any pets around you, be aware of where they are. Review your medicines with your doctor. Some medicines can make you feel dizzy. This can increase your chance of falling. Ask your doctor what other things that you can do to help prevent falls. This information is not intended to replace advice given to you by your health care provider. Make sure you discuss any questions you have with your health care provider. Document Released: 11/06/2008 Document Revised: 06/18/2015 Document Reviewed: 02/14/2014 Elsevier Interactive Patient Education  2017 [employer].

## 2023-04-26 ENCOUNTER — Other Ambulatory Visit: Payer: Self-pay | Admitting: Family Medicine

## 2023-04-26 DIAGNOSIS — I10 Essential (primary) hypertension: Secondary | ICD-10-CM

## 2023-04-27 NOTE — Telephone Encounter (Signed)
 Requested Prescriptions  Pending Prescriptions Disp Refills   lisinopril (ZESTRIL) 20 MG tablet [Pharmacy Med Name: LISINOPRIL 20 MG TABLET] 90 tablet 0    Sig: TAKE 1 TABLET BY MOUTH EVERY DAY     Cardiovascular:  ACE Inhibitors Passed - 04/27/2023  1:37 PM      Passed - Cr in normal range and within 180 days    Creat  Date Value Ref Range Status  11/01/2022 0.89 0.70 - 1.28 mg/dL Final   Creatinine, Urine  Date Value Ref Range Status  02/22/2022 248 20 - 320 mg/dL Final         Passed - K in normal range and within 180 days    Potassium  Date Value Ref Range Status  11/01/2022 4.4 3.5 - 5.3 mmol/L Final         Passed - Patient is not pregnant      Passed - Last BP in normal range    BP Readings from Last 1 Encounters:  11/01/22 118/72         Passed - Valid encounter within last 6 months    Recent Outpatient Visits           5 months ago Diabetes mellitus without complication [facility])   [facility] [facility] Donita Brooks, MD   1 year ago Diabetes mellitus without complication [facility])   [facility] Pickard, Priscille Heidelberg, MD

## 2023-07-22 ENCOUNTER — Other Ambulatory Visit: Payer: Self-pay | Admitting: Family Medicine

## 2023-07-22 DIAGNOSIS — I10 Essential (primary) hypertension: Secondary | ICD-10-CM

## 2023-07-24 NOTE — Telephone Encounter (Signed)
 Copied from CRM [id number]. Topic: Clinical - Prescription Issue >> Jul 24, 2023  9:17 AM Ernestene P wrote: Reason for CRM: Pt advise he is currently out of lisinopril  (ZESTRIL ) 20 MG tablet - advise medication is pending- but pt state he is completely out

## 2023-08-08 ENCOUNTER — Ambulatory Visit: Admitting: Family Medicine

## 2023-08-08 ENCOUNTER — Encounter: Payer: Self-pay | Admitting: Family Medicine

## 2023-08-08 VITALS — BP 120/72 | HR 60 | Temp 97.5°F | Ht 75.0 in | Wt 228.0 lb

## 2023-08-08 DIAGNOSIS — Z7984 Long term (current) use of oral hypoglycemic drugs: Secondary | ICD-10-CM

## 2023-08-08 DIAGNOSIS — I10 Essential (primary) hypertension: Secondary | ICD-10-CM

## 2023-08-08 DIAGNOSIS — E119 Type 2 diabetes mellitus without complications: Secondary | ICD-10-CM | POA: Diagnosis not present

## 2023-08-08 NOTE — Progress Notes (Signed)
 Subjective:    Patient ID: Scott Ballard., male    DOB: 06-12-1945, 78 y.o.   MRN: [id number]  Patient is here today for a checkup.  He is a very pleasant 78 year old Caucasian gentleman with a history of diabetes mellitus.  Patient denies any chest pain shortness of breath or dyspnea on exertion.  He has severe sun damage to his right temple and right cheek.  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  I have recommended that he follow-up with his dermatologist regarding this.  He is due for the shingles vaccine.  He is also due for lab work to monitor his diabetes. Past Medical History:  Diagnosis Date   Diabetes mellitus without complication (HCC)    Diet controlled versus prediabetic, no longer on medication   Hypertension    Melanoma (HCC)    left neck, excision at [facility] (05/2019) free margns and lymph node negative   Smoker    Past Surgical History:  Procedure Laterality Date   CHOLECYSTECTOMY N/A 09/25/2013   Procedure: LAPAROSCOPIC CHOLECYSTECTOMY;  Surgeon: Oneil DELENA Budge, MD;  Location: [facility];  Service: General;  Laterality: N/A;   KNEE ARTHROSCOPY     MELANOMA EXCISION     sinus     Current Outpatient Medications on File Prior to Visit  Medication Sig Dispense Refill   aspirin  81 MG tablet Take 81 mg by mouth daily.     cetirizine (ZYRTEC) 10 MG tablet Take 10 mg by mouth daily.     cholecalciferol (VITAMIN D3) 25 MCG (1000 UNIT) tablet Take 1,000 Units by mouth daily.     CINNAMON PO Take 1 capsule by mouth every other day.     Cyanocobalamin (VITAMIN B 12 PO) Take 1 tablet by mouth daily.     lisinopril  (ZESTRIL ) 20 MG tablet TAKE 1 TABLET BY MOUTH EVERY DAY 90 tablet 0   metFORMIN  (GLUCOPHAGE -XR) 500 MG 24 hr tablet TAKE 2 TABLETS BY MOUTH EVERY DAY WITH BREAKFAST 180 tablet 1   rosuvastatin  (CRESTOR ) 10 MG tablet Take 1 tablet (10 mg total) by mouth daily. 90 tablet 3   No current facility-administered  medications on file prior to visit.   No Known Allergies Social History   Socioeconomic History   Marital status: Married    Spouse name: Not on file   Number of children: Not on file   Years of education: Not on file   Highest education level: Not on file  Occupational History   Not on file  Tobacco Use   Smoking status: Some Days    Types: Cigars   Smokeless tobacco: Never   Tobacco comments:    chews on cigars  Substance and Sexual Activity   Alcohol use: No   Drug use: No   Sexual activity: Yes  Other Topics Concern   Not on file  Social History Narrative   Not on file   Social Drivers of Health   Financial Resource Strain: Low Risk  (03/23/2023)   Overall Financial Resource Strain (CARDIA)    Difficulty of Paying Living Expenses: Not hard at all  Food Insecurity: No Food Insecurity (03/23/2023)   Hunger Vital Sign    Worried About Running Out of Food in the Last Year: Never true    Ran Out of Food in the Last Year: Never true  Transportation Needs: No Transportation Needs (03/23/2023)   PRAPARE - Transportation    Lack  of Transportation (Medical): No    Lack of Transportation (Non-Medical): No  Physical Activity: Insufficiently Active (03/23/2023)   Exercise Vital Sign    Days of Exercise per Week: 3 days    Minutes of Exercise per Session: 30 min  Stress: No Stress Concern Present (03/23/2023)   [employer] of Occupational Health - Occupational Stress Questionnaire    Feeling of Stress : Not at all  Social Connections: Moderately Integrated (03/23/2023)   Social Connection and Isolation Panel    Frequency of Communication with Friends and Family: Twice a week    Frequency of Social Gatherings with Friends and Family: Three times a week    Attends Religious Services: Never    Active Member of Clubs or Organizations: Yes    Attends Banker Meetings: More than 4 times per year    Marital Status: Married  Catering manager Violence: Not At Risk  (03/23/2023)   Humiliation, Afraid, Rape, and Kick questionnaire    Fear of Current or Ex-Partner: No    Emotionally Abused: No    Physically Abused: No    Sexually Abused: No   Family History  Problem Relation Age of Onset   Colon cancer Neg Hx       Review of Systems  All other systems reviewed and are negative.      Objective:   Physical Exam Vitals reviewed.  Constitutional:      General: He is not in acute distress.    Appearance: He is well-developed. He is not diaphoretic.  HENT:     Head: Normocephalic and atraumatic.     Right Ear: External ear normal.     Left Ear: External ear normal.     Nose: Nose normal.     Mouth/Throat:     Pharynx: No oropharyngeal exudate.  Eyes:     General:        Right eye: No discharge.        Left eye: No discharge.     Conjunctiva/sclera: Conjunctivae normal.     Pupils: Pupils are equal, round, and reactive to light.  Neck:     Thyroid : No thyromegaly.     Vascular: No JVD.     Trachea: No tracheal deviation.  Cardiovascular:     Rate and Rhythm: Normal rate and regular rhythm.     Heart sounds: Normal heart sounds. No murmur heard.    No friction rub. No gallop.  Pulmonary:     Effort: Pulmonary effort is normal. No respiratory distress.     Breath sounds: Normal breath sounds. No stridor. No wheezing or rales.  Chest:     Chest wall: No tenderness.  Abdominal:     General: Bowel sounds are normal. There is no distension.     Palpations: Abdomen is soft. There is no mass.     Tenderness: There is no abdominal tenderness. There is no guarding or rebound.  Musculoskeletal:        General: No tenderness or deformity. Normal range of motion.     Cervical back: Normal range of motion.  Lymphadenopathy:     Cervical: No cervical adenopathy.  Skin:    General: Skin is warm.     Findings: Rash present.  Neurological:     Mental Status: He is alert and oriented to person, place, and time.     Cranial Nerves: No cranial  nerve deficit.     Motor: No abnormal muscle tone.     Coordination: Coordination normal.  Deep Tendon Reflexes: Reflexes normal.  Psychiatric:        Behavior: Behavior normal.        Thought Content: Thought content normal.        Judgment: Judgment normal.    widespread ak's on face, cheeks, forehead       Assessment & Plan:  Diabetes mellitus without complication (HCC) - Plan: Hemoglobin A1c, CBC with Differential/Platelet, Comprehensive metabolic panel with GFR, Lipid panel  Primary hypertension Blood pressure today is excellent.  I will check hemoglobin A1c.  Goal A1c is less than 7.  Check fasting panel.  I will see his LDL cholesterol less than 899.  Diabetic foot exam was performed today and is normal.  Recommended dermatology follow-up for the sun damage on the right side of his face.

## 2023-08-09 LAB — LIPID PANEL
Cholesterol: 122 mg/dL (ref <200)
HDL: 54 mg/dL (ref >=40)
LDL Cholesterol (Calc): 48 mg/dL
Non-HDL Cholesterol (Calc): 68 mg/dL (ref <130)
Total CHOL/HDL Ratio: 2.3 (calc) (ref <5.0)
Triglycerides: 113 mg/dL (ref <150)

## 2023-08-09 LAB — COMPREHENSIVE METABOLIC PANEL WITH GFR
AG Ratio: 1.5 (calc) (ref 1.0–2.5)
ALT: 11 U/L (ref 9–46)
AST: 11 U/L (ref 10–35)
Albumin: 4.1 g/dL (ref 3.6–5.1)
Alkaline phosphatase (APISO): 61 U/L (ref 35–144)
BUN: 11 mg/dL (ref 7–25)
CO2: 27 mmol/L (ref 20–32)
Calcium: 9.4 mg/dL (ref 8.6–10.3)
Chloride: 103 mmol/L (ref 98–110)
Creat: 0.89 mg/dL (ref 0.70–1.22)
Globulin: 2.8 g/dL (ref 1.9–3.7)
Glucose, Bld: 209 mg/dL — ABNORMAL HIGH (ref 65–99)
Potassium: 4.2 mmol/L (ref 3.5–5.3)
Sodium: 137 mmol/L (ref 135–146)
Total Bilirubin: 0.7 mg/dL (ref 0.2–1.2)
Total Protein: 6.9 g/dL (ref 6.1–8.1)
eGFR: 87 mL/min/1.73m2 (ref >=60)

## 2023-08-09 LAB — CBC WITH DIFFERENTIAL/PLATELET
Absolute Lymphocytes: 1662 {cells}/uL (ref 850–3900)
Absolute Monocytes: 610 {cells}/uL (ref 200–950)
Basophils Absolute: 60 {cells}/uL (ref 0–200)
Basophils Relative: 0.9 %
Eosinophils Absolute: 161 {cells}/uL (ref 15–500)
Eosinophils Relative: 2.4 %
HCT: 44 % (ref 38.5–50.0)
Hemoglobin: 14.5 g/dL (ref 13.2–17.1)
MCH: 32.2 pg (ref 27.0–33.0)
MCHC: 33 g/dL (ref 32.0–36.0)
MCV: 97.6 fL (ref 80.0–100.0)
MPV: 10.6 fL (ref 7.5–12.5)
Monocytes Relative: 9.1 %
Neutro Abs: 4208 {cells}/uL (ref 1500–7800)
Neutrophils Relative %: 62.8 %
Platelets: 256 Thousand/uL (ref 140–400)
RBC: 4.51 Million/uL (ref 4.20–5.80)
RDW: 12.4 % (ref 11.0–15.0)
Total Lymphocyte: 24.8 %
WBC: 6.7 Thousand/uL (ref 3.8–10.8)

## 2023-08-09 LAB — HEMOGLOBIN A1C
Hgb A1c MFr Bld: 7.3 % — ABNORMAL HIGH (ref <5.7)
Mean Plasma Glucose: 163 mg/dL
eAG (mmol/L): 9 mmol/L

## 2023-08-10 ENCOUNTER — Ambulatory Visit: Payer: Self-pay | Admitting: Family Medicine

## 2023-08-10 NOTE — Progress Notes (Signed)
 Mailbox is full.

## 2023-08-11 ENCOUNTER — Other Ambulatory Visit: Payer: Self-pay

## 2023-08-11 MED ORDER — METFORMIN HCL 500 MG PO TABS: 1000.0000 mg | ORAL_TABLET | Freq: Two times a day (BID) | ORAL | 1 refills | Status: DC

## 2023-08-11 NOTE — Progress Notes (Signed)
 Second attempt not successful. Mailbox still full. Sent result letter to address on file.

## 2023-10-18 ENCOUNTER — Other Ambulatory Visit: Payer: Self-pay | Admitting: Family Medicine

## 2023-10-18 DIAGNOSIS — I10 Essential (primary) hypertension: Secondary | ICD-10-CM

## 2023-11-08 ENCOUNTER — Other Ambulatory Visit: Payer: Self-pay | Admitting: Family Medicine

## 2023-11-13 ENCOUNTER — Ambulatory Visit
Admission: EM | Admit: 2023-11-13 | Discharge: 2023-11-13 | Disposition: A | Discharge status: Home / Self Care | Attending: Student | Admitting: Student

## 2023-11-13 ENCOUNTER — Other Ambulatory Visit: Payer: Self-pay

## 2023-11-13 DIAGNOSIS — H8111 Benign paroxysmal vertigo, right ear: Secondary | ICD-10-CM

## 2023-11-13 DIAGNOSIS — H9190 Unspecified hearing loss, unspecified ear: Secondary | ICD-10-CM

## 2023-11-13 DIAGNOSIS — R2689 Other abnormalities of gait and mobility: Secondary | ICD-10-CM

## 2023-11-13 LAB — GLUCOSE, POCT (MANUAL RESULT ENTRY): POCT Glucose (KUC): 164 mg/dL — AB (ref 70–99)

## 2023-11-13 MED ORDER — MECLIZINE HCL 12.5 MG PO TABS: 12.5000 mg | ORAL_TABLET | Freq: Three times a day (TID) | ORAL | 0 refills | Status: AC | PRN

## 2023-11-13 NOTE — Discharge Instructions (Addendum)
-   Based on your history and exam, I believe you have a condition called vertigo.  I prescribed a medication called meclizine , which you can take up to 3 times daily for dizziness.  This medication can cause drowsiness, so be careful, and avoid driving while on this medication - While I cannot totally exclude intracranial pathology, including stroke, in the urgent care setting, a normal EKG, blood sugar, and exam is a good sign.  If your symptoms change, like severe headaches, vision changes, chest pain-seek immediate medical attention.

## 2023-11-13 NOTE — ED Triage Notes (Signed)
 Pt is having ear fullness and decreased hearing and dizziness that started last night. Pt states he has a hx of vertigo and states it felt like that.

## 2023-11-13 NOTE — ED Provider Notes (Signed)
 [facility]    CSN: [id number] Arrival date & time: 11/13/23  1038      History   Chief Complaint Chief Complaint  Patient presents with   Ear Fullness    HPI Scott Ballard. is a 78 y.o. male presenting with right ear fullness, decreased hearing, vertigo.  Medical history diabetes, hypertension, vertigo.  The patient describes 20 hours of right ear fullness, decreased hearing, and disequilibrium.  The symptoms are consistent with his past vertigo symptoms.  He denies headache, visual changes, chest pain, shortness of breath.  He does not have a history of stroke.  The patient comments that his cardiologist has said that he probably had a heart attack in the past.  Denies recent URI, nasal congestion, allergic rhinitis.  HPI  Past Medical History:  Diagnosis Date   Diabetes mellitus without complication (HCC)    Diet controlled versus prediabetic, no longer on medication   Hypertension    Melanoma (HCC)    left neck, excision at [facility] (05/2019) free margns and lymph node negative   Smoker     Patient Active Problem List   Diagnosis Date Noted   Melanoma (HCC)    Heme positive stool 03/10/2016   Abnormal LFTs 02/04/2014   Special screening for malignant neoplasms, colon 02/04/2014   Symptomatic cholelithiasis 09/23/2013   Leukocytosis 09/23/2013   Acute cholecystitis 09/23/2013   Smoker 01/28/2013   Diabetes mellitus without complication (HCC)    Hypertension     Past Surgical History:  Procedure Laterality Date   CHOLECYSTECTOMY N/A 09/25/2013   Procedure: LAPAROSCOPIC CHOLECYSTECTOMY;  Surgeon: Oneil DELENA Budge, MD;  Location: [facility];  Service: General;  Laterality: N/A;   KNEE ARTHROSCOPY     MELANOMA EXCISION     sinus         Home Medications    Prior to Admission medications   Medication Sig Start Date End Date Taking? Authorizing Provider  meclizine  (ANTIVERT ) 12.5 MG tablet Take 1 tablet (12.5 mg total) by mouth 3 (three) times daily as  needed for dizziness. 11/13/23  Yes Arlyss Leita BRAVO, PA-C  aspirin  81 MG tablet Take 81 mg by mouth daily.    [provider]  cetirizine (ZYRTEC) 10 MG tablet Take 10 mg by mouth daily.    [provider]  cholecalciferol (VITAMIN D3) 25 MCG (1000 UNIT) tablet Take 1,000 Units by mouth daily.    [provider]  CINNAMON PO Take 1 capsule by mouth every other day.    [provider]  Cyanocobalamin (VITAMIN B 12 PO) Take 1 tablet by mouth daily.    [provider]  lisinopril  (ZESTRIL ) 20 MG tablet TAKE 1 TABLET BY MOUTH EVERY DAY 10/18/23   Duanne Butler DASEN, MD  metFORMIN  (GLUCOPHAGE ) 500 MG tablet Take 2 tablets (1,000 mg total) by mouth 2 (two) times daily with a meal. 08/11/23   Duanne Butler DASEN, MD  rosuvastatin  (CRESTOR ) 10 MG tablet TAKE 1 TABLET BY MOUTH EVERY DAY 11/08/23   Duanne Butler DASEN, MD    Family History Family History  Problem Relation Age of Onset   Colon cancer Neg Hx     Social History Social History   Tobacco Use   Smoking status: Some Days    Types: Cigars   Smokeless tobacco: Never   Tobacco comments:    chews on cigars  Substance Use Topics   Alcohol use: No   Drug use: No     Allergies  Patient has no known allergies.   Review of Systems Review of Systems  Constitutional:  Negative for appetite change, chills and fever.  HENT:  Positive for ear pain. Negative for congestion, rhinorrhea, sinus pressure, sinus pain and sore throat.   Eyes:  Negative for redness and visual disturbance.  Respiratory:  Negative for cough, chest tightness, shortness of breath and wheezing.   Cardiovascular:  Negative for chest pain and palpitations.  Gastrointestinal:  Negative for abdominal pain, constipation, diarrhea, nausea and vomiting.  Genitourinary:  Negative for dysuria, frequency and urgency.  Musculoskeletal:  Negative for myalgias.  Neurological:  Positive for dizziness. Negative for weakness and headaches.   Psychiatric/Behavioral:  Negative for confusion.   All other systems reviewed and are negative.    Physical Exam Triage Vital Signs ED Triage Vitals  Encounter Vitals Group     BP 11/13/23 1100 (!) 162/84     Girls Systolic BP Percentile --      Girls Diastolic BP Percentile --      Boys Systolic BP Percentile --      Boys Diastolic BP Percentile --      Pulse Rate 11/13/23 1100 (!) 59     Resp 11/13/23 1100 18     Temp 11/13/23 1100 98 F (36.7 C)     Temp Source 11/13/23 1100 Oral     SpO2 11/13/23 1117 94 %     Weight --      Height --      Head Circumference --      Peak Flow --      Pain Score 11/13/23 1104 0     Pain Loc --      Pain Education --      Exclude from Growth Chart --    No data found.  Updated Vital Signs BP (!) 162/84 (BP Location: Right Arm)   Pulse (!) 59   Temp 98 F (36.7 C) (Oral)   Resp 18   SpO2 94%   Visual Acuity Right Eye Distance:   Left Eye Distance:   Bilateral Distance:    Right Eye Near:   Left Eye Near:    Bilateral Near:     Physical Exam Vitals reviewed.  Constitutional:      Appearance: Normal appearance. He is not ill-appearing.  HENT:     Head: Normocephalic and atraumatic.     Right Ear: Hearing, tympanic membrane, ear canal and external ear normal. No swelling or tenderness. No middle ear effusion. There is no impacted cerumen. No mastoid tenderness. Tympanic membrane is not injected, scarred, perforated, erythematous, retracted or bulging.     Left Ear: Hearing, tympanic membrane, ear canal and external ear normal. No swelling or tenderness.  No middle ear effusion. There is no impacted cerumen. No mastoid tenderness. Tympanic membrane is not injected, scarred, perforated, erythematous, retracted or bulging.     Mouth/Throat:     Pharynx: Oropharynx is clear. No oropharyngeal exudate or posterior oropharyngeal erythema.  Eyes:     General: No visual field deficit. Cardiovascular:     Rate and Rhythm: Normal  rate and regular rhythm.     Heart sounds: Normal heart sounds.  Pulmonary:     Effort: Pulmonary effort is normal.     Breath sounds: Normal breath sounds.  Lymphadenopathy:     Cervical: No cervical adenopathy.  Neurological:     General: No focal deficit present.     Mental Status: He is alert and oriented to person, place, and  time.     Cranial Nerves: Cranial nerves 2-12 are intact. No cranial nerve deficit, dysarthria or facial asymmetry.     Sensory: No sensory deficit.     Motor: Motor function is intact. No weakness, tremor, atrophy, abnormal muscle tone or pronator drift.     Coordination: Romberg sign negative.     Comments: Cranial nerves II through XII grossly intact.  Strength and sensation intact.  Fingers to thumb intact.  Negative Romberg, negative pronator drift.  Psychiatric:        Mood and Affect: Mood normal.        Behavior: Behavior normal.        Thought Content: Thought content normal.        Judgment: Judgment normal.      UC Treatments / Results  Labs (all labs ordered are listed, but only abnormal results are displayed) Labs Reviewed  GLUCOSE, POCT (MANUAL RESULT ENTRY) - Abnormal; Notable for the following components:      Result Value   POCT Glucose ([facility]) 164 (*)    All other components within normal limits    EKG   Radiology No results found.  Procedures Procedures (including critical care time)  Medications Ordered in UC Medications - No data to display  Initial Impression / Assessment and Plan / UC Course  I have reviewed the triage vital signs and the nursing notes.  Pertinent labs & imaging results that were available during my care of the patient were reviewed by me and considered in my medical decision making (see chart for details).     Patient is an 78 year old male presenting with recurrence of vertigo.  He is afebrile, nontachycardic.  His blood pressure is slightly elevated today at 162/84.  Neuroexam is reassuring.  EKG  is unchanged compared with 04/2023 EKG.  Nonfasting CBG 164.  We discussed that I cannot exclude intracranial pathology, including stroke, in the urgent care setting.  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  Trial of meclizine .  He understands that if symptoms persist, he should contact his primary care provider, and if symptoms change or worsen, he should head to the emergency department.  He is not wearing his hearing aids today.  Very hard of hearing.  Final Clinical Impressions(s) / UC Diagnoses   Final diagnoses:  Balance problem  Benign paroxysmal positional vertigo of right ear  Hard of hearing     Discharge Instructions      - Based on your history and exam, I believe you have a condition called vertigo.  I prescribed a medication called meclizine , which you can take up to 3 times daily for dizziness.  This medication can cause drowsiness, so be careful, and avoid driving while on this medication - While I cannot totally exclude intracranial pathology, including stroke, in the urgent care setting, a normal EKG, blood sugar, and exam is a good sign.  If your symptoms change, like severe headaches, vision changes, chest pain-seek immediate medical attention.   ED Prescriptions     Medication Sig Dispense Auth. Provider   meclizine  (ANTIVERT ) 12.5 MG tablet Take 1 tablet (12.5 mg total) by mouth 3 (three) times daily as needed for dizziness. 30 tablet Anntionette Madkins E, PA-C      PDMP not reviewed this encounter.   Arlyss Leita BRAVO, PA-C 11/13/23 1135

## 2023-11-25 ENCOUNTER — Ambulatory Visit
Admission: EM | Admit: 2023-11-25 | Discharge: 2023-11-25 | Disposition: A | Discharge status: Home / Self Care | Attending: Family Medicine | Admitting: Family Medicine

## 2023-11-25 DIAGNOSIS — J22 Unspecified acute lower respiratory infection: Secondary | ICD-10-CM

## 2023-11-25 MED ORDER — PROMETHAZINE-DM 6.25-15 MG/5ML PO SYRP: 5.0000 mL | ORAL_SOLUTION | Freq: Four times a day (QID) | ORAL | 0 refills | Status: AC | PRN

## 2023-11-25 MED ORDER — GUAIFENESIN ER 600 MG PO TB12: 600.0000 mg | ORAL_TABLET | Freq: Two times a day (BID) | ORAL | 0 refills | Status: AC | PRN

## 2023-11-25 MED ORDER — AZITHROMYCIN 250 MG PO TABS: ORAL_TABLET | ORAL | 0 refills | Status: AC

## 2023-11-25 NOTE — Discharge Instructions (Signed)
 In addition to the prescribed medications, you may take Coricidin HBP, use saline sinus rinses, Tylenol  as needed.  Follow-up for worsening or unresolving symptoms.

## 2023-11-25 NOTE — ED Triage Notes (Addendum)
 Pt reports sinus drainage,cough was seen by primary told to use nasal mist, hasn't helped much. Has cough so hard now when breathing has pain in the ribs. Sx's x 4 weeks , pt also reports ear fullness/ difficulty hearing.

## 2023-11-25 NOTE — ED Provider Notes (Signed)
 [facility]    CSN: [id number] Arrival date & time: 11/25/23  1034      History   Chief Complaint No chief complaint on file.   HPI Scott Ballard. is a 78 y.o. male.   Pt reports sinus drainage,cough was seen by primary told to use nasal mist, hasn't helped much. Has cough so hard now when breathing has pain in the ribs. Sx's x 4 weeks , pt also reports ear fullness/ difficulty hearing.       Past Medical History:  Diagnosis Date  . Diabetes mellitus without complication (HCC)    Diet controlled versus prediabetic, no longer on medication  . Hypertension   . Melanoma (HCC)    left neck, excision at [facility] (05/2019) free margns and lymph node negative  . Smoker     Patient Active Problem List   Diagnosis Date Noted  . Melanoma (HCC)   . Heme positive stool 03/10/2016  . Abnormal LFTs 02/04/2014  . Special screening for malignant neoplasms, colon 02/04/2014  . Symptomatic cholelithiasis 09/23/2013  . Leukocytosis 09/23/2013  . Acute cholecystitis 09/23/2013  . Smoker 01/28/2013  . Diabetes mellitus without complication (HCC)   . Hypertension     Past Surgical History:  Procedure Laterality Date  . CHOLECYSTECTOMY N/A 09/25/2013   Procedure: LAPAROSCOPIC CHOLECYSTECTOMY;  Surgeon: Oneil DELENA Budge, MD;  Location: [facility];  Service: General;  Laterality: N/A;  . KNEE ARTHROSCOPY    . MELANOMA EXCISION    . sinus         Home Medications    Prior to Admission medications   Medication Sig Start Date End Date Taking? Authorizing Provider  azithromycin (ZITHROMAX) 250 MG tablet Take first 2 tablets together, then 1 every day until finished. 11/25/23  Yes Stuart Vernell Norris, PA-C  guaiFENesin (MUCINEX) 600 MG 12 hr tablet Take 1 tablet (600 mg total) by mouth 2 (two) times daily as needed. 11/25/23  Yes Stuart Vernell Norris, PA-C  promethazine-dextromethorphan (PROMETHAZINE-DM) 6.25-15 MG/5ML syrup Take 5 mLs by mouth 4 (four) times daily as needed.  11/25/23  Yes Stuart Vernell Norris, PA-C  aspirin  81 MG tablet Take 81 mg by mouth daily.    [provider]  cetirizine (ZYRTEC) 10 MG tablet Take 10 mg by mouth daily.    [provider]  cholecalciferol (VITAMIN D3) 25 MCG (1000 UNIT) tablet Take 1,000 Units by mouth daily.    [provider]  CINNAMON PO Take 1 capsule by mouth every other day.    [provider]  Cyanocobalamin (VITAMIN B 12 PO) Take 1 tablet by mouth daily.    [provider]  lisinopril  (ZESTRIL ) 20 MG tablet TAKE 1 TABLET BY MOUTH EVERY DAY 10/18/23   Duanne Butler DASEN, MD  meclizine  (ANTIVERT ) 12.5 MG tablet Take 1 tablet (12.5 mg total) by mouth 3 (three) times daily as needed for dizziness. 11/13/23   Graham, Laura E, PA-C  metFORMIN  (GLUCOPHAGE ) 500 MG tablet Take 2 tablets (1,000 mg total) by mouth 2 (two) times daily with a meal. 08/11/23   Duanne Butler DASEN, MD  rosuvastatin  (CRESTOR ) 10 MG tablet TAKE 1 TABLET BY MOUTH EVERY DAY 11/08/23   Duanne Butler DASEN, MD    Family History Family History  Problem Relation Age of Onset  . Colon cancer Neg Hx     Social History Social History   Tobacco Use  . Smoking status: Some Days    Types: Cigars  . Smokeless tobacco: Never  .  Tobacco comments:    chews on cigars  Substance Use Topics  . Alcohol use: No  . Drug use: No     Allergies   Patient has no known allergies.   Review of Systems Review of Systems   Physical Exam Triage Vital Signs ED Triage Vitals  Encounter Vitals Group     BP 11/25/23 1152 (!) 149/76     Girls Systolic BP Percentile --      Girls Diastolic BP Percentile --      Boys Systolic BP Percentile --      Boys Diastolic BP Percentile --      Pulse Rate 11/25/23 1152 (!) 57     Resp 11/25/23 1152 16     Temp 11/25/23 1152 98.6 F (37 C)     Temp Source 11/25/23 1152 Oral     SpO2 11/25/23 1152 92 %     Weight --      Height --      Head Circumference --      Peak Flow --       Pain Score 11/25/23 1153 0     Pain Loc --      Pain Education --      Exclude from Growth Chart --    No data found.  Updated Vital Signs BP (!) 149/76 (BP Location: Right Arm)   Pulse (!) 57   Temp 98.6 F (37 C) (Oral)   Resp 16   SpO2 92%   Visual Acuity Right Eye Distance:   Left Eye Distance:   Bilateral Distance:    Right Eye Near:   Left Eye Near:    Bilateral Near:     Physical Exam   UC Treatments / Results  Labs (all labs ordered are listed, but only abnormal results are displayed) Labs Reviewed - No data to display  EKG   Radiology No results found.  Procedures Procedures (including critical care time)  Medications Ordered in UC Medications - No data to display  Initial Impression / Assessment and Plan / UC Course  I have reviewed the triage vital signs and the nursing notes.  Pertinent labs & imaging results that were available during my care of the patient were reviewed by me and considered in my medical decision making (see chart for details).     *** Final Clinical Impressions(s) / UC Diagnoses   Final diagnoses:  Lower respiratory infection     Discharge Instructions      In addition to the prescribed medications, you may take Coricidin HBP, use saline sinus rinses, Tylenol  as needed.  Follow-up for worsening or unresolving symptoms.   ED Prescriptions     Medication Sig Dispense Auth. Provider   azithromycin (ZITHROMAX) 250 MG tablet Take first 2 tablets together, then 1 every day until finished. 6 tablet Stuart Vernell Norris, PA-C   guaiFENesin (MUCINEX) 600 MG 12 hr tablet Take 1 tablet (600 mg total) by mouth 2 (two) times daily as needed. 20 tablet Stuart Vernell Norris, PA-C   promethazine-dextromethorphan (PROMETHAZINE-DM) 6.25-15 MG/5ML syrup Take 5 mLs by mouth 4 (four) times daily as needed. 100 mL Stuart Vernell Norris, [place]      PDMP not reviewed this encounter.

## 2024-01-16 ENCOUNTER — Other Ambulatory Visit: Payer: Self-pay | Admitting: Family Medicine

## 2024-01-16 DIAGNOSIS — I10 Essential (primary) hypertension: Secondary | ICD-10-CM

## 2024-02-07 ENCOUNTER — Other Ambulatory Visit: Payer: Self-pay | Admitting: Family Medicine
